# Patient Record
Sex: Female | Born: 1950 | Race: White | Hispanic: No | Marital: Married | Smoking: Former smoker
Health system: Southern US, Community
[De-identification: ages and names within clinical notes are randomized; demographics above are authoritative.]

## PROBLEM LIST (undated history)

## (undated) DIAGNOSIS — N840 Polyp of corpus uteri: Secondary | ICD-10-CM

## (undated) DIAGNOSIS — N3010 Interstitial cystitis (chronic) without hematuria: Secondary | ICD-10-CM

## (undated) DIAGNOSIS — I10 Essential (primary) hypertension: Secondary | ICD-10-CM

## (undated) DIAGNOSIS — G43909 Migraine, unspecified, not intractable, without status migrainosus: Secondary | ICD-10-CM

## (undated) DIAGNOSIS — N952 Postmenopausal atrophic vaginitis: Secondary | ICD-10-CM

## (undated) DIAGNOSIS — N938 Other specified abnormal uterine and vaginal bleeding: Secondary | ICD-10-CM

## (undated) DIAGNOSIS — M8580 Other specified disorders of bone density and structure, unspecified site: Secondary | ICD-10-CM

## (undated) DIAGNOSIS — K5792 Diverticulitis of intestine, part unspecified, without perforation or abscess without bleeding: Secondary | ICD-10-CM

## (undated) DIAGNOSIS — C801 Malignant (primary) neoplasm, unspecified: Secondary | ICD-10-CM

## (undated) DIAGNOSIS — E079 Disorder of thyroid, unspecified: Secondary | ICD-10-CM

## (undated) HISTORY — PX: TUBAL LIGATION: SHX77

## (undated) HISTORY — DX: Other specified disorders of bone density and structure, unspecified site: M85.80

## (undated) HISTORY — DX: Migraine, unspecified, not intractable, without status migrainosus: G43.909

## (undated) HISTORY — DX: Malignant (primary) neoplasm, unspecified: C80.1

## (undated) HISTORY — DX: Disorder of thyroid, unspecified: E07.9

## (undated) HISTORY — DX: Essential (primary) hypertension: I10

## (undated) HISTORY — DX: Other specified abnormal uterine and vaginal bleeding: N93.8

## (undated) HISTORY — DX: Interstitial cystitis (chronic) without hematuria: N30.10

## (undated) HISTORY — PX: BREAST SURGERY: SHX581

## (undated) HISTORY — DX: Polyp of corpus uteri: N84.0

## (undated) HISTORY — DX: Postmenopausal atrophic vaginitis: N95.2

---

## 1994-03-09 DIAGNOSIS — C801 Malignant (primary) neoplasm, unspecified: Secondary | ICD-10-CM

## 1994-03-09 HISTORY — DX: Malignant (primary) neoplasm, unspecified: C80.1

## 1997-11-14 ENCOUNTER — Emergency Department (HOSPITAL_COMMUNITY): Admission: EM | Admit: 1997-11-14 | Discharge: 1997-11-14 | Payer: Self-pay | Admitting: Family Medicine

## 1998-03-09 HISTORY — PX: DILATION AND CURETTAGE OF UTERUS: SHX78

## 1998-05-15 ENCOUNTER — Other Ambulatory Visit: Admission: RE | Admit: 1998-05-15 | Discharge: 1998-05-15 | Payer: Self-pay | Admitting: Obstetrics and Gynecology

## 1998-11-21 ENCOUNTER — Ambulatory Visit (HOSPITAL_COMMUNITY): Admission: RE | Admit: 1998-11-21 | Discharge: 1998-11-21 | Payer: Self-pay | Admitting: Obstetrics and Gynecology

## 1998-11-21 ENCOUNTER — Encounter: Payer: Self-pay | Admitting: Obstetrics and Gynecology

## 1999-01-16 ENCOUNTER — Encounter (INDEPENDENT_AMBULATORY_CARE_PROVIDER_SITE_OTHER): Payer: Self-pay | Admitting: Specialist

## 1999-01-16 ENCOUNTER — Ambulatory Visit (HOSPITAL_COMMUNITY): Admission: RE | Admit: 1999-01-16 | Discharge: 1999-01-16 | Payer: Self-pay | Admitting: Obstetrics and Gynecology

## 1999-02-26 ENCOUNTER — Other Ambulatory Visit: Admission: RE | Admit: 1999-02-26 | Discharge: 1999-02-26 | Payer: Self-pay | Admitting: Obstetrics and Gynecology

## 1999-02-26 ENCOUNTER — Encounter (INDEPENDENT_AMBULATORY_CARE_PROVIDER_SITE_OTHER): Payer: Self-pay

## 2000-01-01 ENCOUNTER — Other Ambulatory Visit: Admission: RE | Admit: 2000-01-01 | Discharge: 2000-01-01 | Payer: Self-pay | Admitting: Obstetrics and Gynecology

## 2000-11-18 ENCOUNTER — Encounter (INDEPENDENT_AMBULATORY_CARE_PROVIDER_SITE_OTHER): Payer: Self-pay | Admitting: *Deleted

## 2000-11-18 ENCOUNTER — Ambulatory Visit (HOSPITAL_COMMUNITY): Admission: RE | Admit: 2000-11-18 | Discharge: 2000-11-18 | Payer: Self-pay | Admitting: Obstetrics and Gynecology

## 2001-03-09 HISTORY — PX: VAGINAL HYSTERECTOMY: SUR661

## 2001-05-05 ENCOUNTER — Other Ambulatory Visit: Admission: RE | Admit: 2001-05-05 | Discharge: 2001-05-05 | Payer: Self-pay | Admitting: Obstetrics and Gynecology

## 2001-12-01 ENCOUNTER — Encounter (INDEPENDENT_AMBULATORY_CARE_PROVIDER_SITE_OTHER): Payer: Self-pay | Admitting: Specialist

## 2001-12-01 ENCOUNTER — Observation Stay (HOSPITAL_COMMUNITY): Admission: RE | Admit: 2001-12-01 | Discharge: 2001-12-02 | Payer: Self-pay | Admitting: Obstetrics and Gynecology

## 2002-05-23 ENCOUNTER — Other Ambulatory Visit: Admission: RE | Admit: 2002-05-23 | Discharge: 2002-05-23 | Payer: Self-pay | Admitting: Obstetrics and Gynecology

## 2002-07-11 ENCOUNTER — Encounter (INDEPENDENT_AMBULATORY_CARE_PROVIDER_SITE_OTHER): Payer: Self-pay | Admitting: Specialist

## 2002-07-11 ENCOUNTER — Ambulatory Visit (HOSPITAL_BASED_OUTPATIENT_CLINIC_OR_DEPARTMENT_OTHER): Admission: RE | Admit: 2002-07-11 | Discharge: 2002-07-11 | Payer: Self-pay | Admitting: Urology

## 2003-05-24 ENCOUNTER — Other Ambulatory Visit: Admission: RE | Admit: 2003-05-24 | Discharge: 2003-05-24 | Payer: Self-pay | Admitting: Obstetrics and Gynecology

## 2004-04-06 ENCOUNTER — Emergency Department (HOSPITAL_COMMUNITY): Admission: EM | Admit: 2004-04-06 | Discharge: 2004-04-06 | Payer: Self-pay | Admitting: Emergency Medicine

## 2004-04-10 ENCOUNTER — Ambulatory Visit: Payer: Self-pay | Admitting: Gastroenterology

## 2004-04-11 ENCOUNTER — Ambulatory Visit (HOSPITAL_COMMUNITY): Admission: RE | Admit: 2004-04-11 | Discharge: 2004-04-11 | Payer: Self-pay | Admitting: Gastroenterology

## 2004-04-11 ENCOUNTER — Ambulatory Visit: Payer: Self-pay | Admitting: Cardiology

## 2004-04-14 ENCOUNTER — Ambulatory Visit: Payer: Self-pay

## 2004-04-18 ENCOUNTER — Ambulatory Visit: Payer: Self-pay | Admitting: Gastroenterology

## 2004-04-18 ENCOUNTER — Ambulatory Visit: Payer: Self-pay | Admitting: Cardiology

## 2004-05-19 ENCOUNTER — Ambulatory Visit: Payer: Self-pay | Admitting: Gastroenterology

## 2004-05-27 ENCOUNTER — Other Ambulatory Visit: Admission: RE | Admit: 2004-05-27 | Discharge: 2004-05-27 | Payer: Self-pay | Admitting: Obstetrics and Gynecology

## 2004-06-02 ENCOUNTER — Ambulatory Visit (HOSPITAL_COMMUNITY): Admission: RE | Admit: 2004-06-02 | Discharge: 2004-06-02 | Payer: Self-pay | Admitting: Gynecology

## 2004-06-09 ENCOUNTER — Ambulatory Visit (HOSPITAL_COMMUNITY): Admission: RE | Admit: 2004-06-09 | Discharge: 2004-06-09 | Payer: Self-pay | Admitting: Gastroenterology

## 2004-06-19 ENCOUNTER — Ambulatory Visit: Payer: Self-pay | Admitting: Gastroenterology

## 2005-06-19 ENCOUNTER — Other Ambulatory Visit: Admission: RE | Admit: 2005-06-19 | Discharge: 2005-06-19 | Payer: Self-pay | Admitting: Obstetrics and Gynecology

## 2006-08-19 ENCOUNTER — Other Ambulatory Visit: Admission: RE | Admit: 2006-08-19 | Discharge: 2006-08-19 | Payer: Self-pay | Admitting: Obstetrics and Gynecology

## 2007-09-06 ENCOUNTER — Other Ambulatory Visit: Admission: RE | Admit: 2007-09-06 | Discharge: 2007-09-06 | Payer: Self-pay | Admitting: Obstetrics and Gynecology

## 2008-09-12 ENCOUNTER — Ambulatory Visit: Payer: Self-pay | Admitting: Obstetrics and Gynecology

## 2008-09-12 ENCOUNTER — Encounter: Payer: Self-pay | Admitting: Obstetrics and Gynecology

## 2008-09-12 ENCOUNTER — Other Ambulatory Visit: Admission: RE | Admit: 2008-09-12 | Discharge: 2008-09-12 | Payer: Self-pay | Admitting: Obstetrics and Gynecology

## 2009-09-04 ENCOUNTER — Emergency Department (HOSPITAL_COMMUNITY): Admission: EM | Admit: 2009-09-04 | Discharge: 2009-09-05 | Payer: Self-pay | Admitting: Emergency Medicine

## 2009-09-12 ENCOUNTER — Ambulatory Visit: Payer: Self-pay | Admitting: Oncology

## 2009-09-16 ENCOUNTER — Other Ambulatory Visit: Admission: RE | Admit: 2009-09-16 | Discharge: 2009-09-16 | Payer: Self-pay | Admitting: Obstetrics and Gynecology

## 2009-09-16 ENCOUNTER — Ambulatory Visit: Payer: Self-pay | Admitting: Obstetrics and Gynecology

## 2009-09-19 ENCOUNTER — Ambulatory Visit: Payer: Self-pay | Admitting: Obstetrics and Gynecology

## 2009-10-29 ENCOUNTER — Ambulatory Visit: Payer: Self-pay | Admitting: Obstetrics and Gynecology

## 2010-03-30 ENCOUNTER — Encounter: Payer: Self-pay | Admitting: Gynecology

## 2010-05-25 LAB — POCT CARDIAC MARKERS
CKMB, poc: 1.2 ng/mL (ref 1.0–8.0)
Myoglobin, poc: 73.8 ng/mL (ref 12–200)

## 2010-05-25 LAB — DIFFERENTIAL
Basophils Absolute: 0 10*3/uL (ref 0.0–0.1)
Basophils Relative: 1 % (ref 0–1)
Eosinophils Absolute: 0.1 10*3/uL (ref 0.0–0.7)
Lymphocytes Relative: 28 % (ref 12–46)
Neutro Abs: 3.8 10*3/uL (ref 1.7–7.7)

## 2010-05-25 LAB — URINALYSIS, ROUTINE W REFLEX MICROSCOPIC
Hgb urine dipstick: NEGATIVE
Urobilinogen, UA: 0.2 mg/dL (ref 0.0–1.0)
pH: 6.5 (ref 5.0–8.0)

## 2010-05-25 LAB — URINE MICROSCOPIC-ADD ON

## 2010-05-25 LAB — COMPREHENSIVE METABOLIC PANEL
AST: 21 U/L (ref 0–37)
Albumin: 4.2 g/dL (ref 3.5–5.2)
CO2: 25 mEq/L (ref 19–32)
Calcium: 9.1 mg/dL (ref 8.4–10.5)
Chloride: 108 mEq/L (ref 96–112)
Creatinine, Ser: 1.03 mg/dL (ref 0.4–1.2)
GFR calc Af Amer: >60 mL/min (ref >60)
Glucose, Bld: 101 mg/dL — ABNORMAL HIGH (ref 70–99)
Potassium: 3.4 mEq/L — ABNORMAL LOW (ref 3.5–5.1)
Total Bilirubin: 0.5 mg/dL (ref 0.3–1.2)

## 2010-05-25 LAB — CBC
HCT: 41 % (ref 36.0–46.0)
Platelets: 222 10*3/uL (ref 150–400)
RBC: 4.4 MIL/uL (ref 3.87–5.11)
RDW: 12.6 % (ref 11.5–15.5)

## 2010-05-25 LAB — LIPASE, BLOOD: Lipase: 46 U/L (ref 11–59)

## 2010-05-25 LAB — URINE CULTURE

## 2010-07-25 NOTE — Op Note (Signed)
[facility]  Patient:    Paige Spencer, Paige Spencer Visit Number: [id number] MRN: [id number]          Service Type: Attending:  Rande Brunt. Eda Paschal, M.D. Dictated by:   Rande Brunt. Eda Paschal, M.D. Proc. Date: 11/18/00                             Operative Report  PREOPERATIVE DIAGNOSIS:       Postmenopausal bleeding with enlarged endometrial stripe and hyperplasia on endometrial biopsy.  POSTOPERATIVE DIAGNOSIS:      Polypoid endometrium, postmenopausal bleeding, hyperplasia on endometrial biopsy.  OPERATION:                    Hysteroscopy, dilatation and curettage.  SURGEON:                      Daniel L. Eda Paschal, M.D.  ANESTHESIA:                   General.  INDICATIONS:                  The patient is a 59 year old, gravida 1, para 1, AB 0, who is status post breast cancer who was seen because of severe symptomatology.  Part of the evaluation included an [facility] which was in the menopausal range.  In spite of FSH of 55, she has continued to have some bleeding.  She had an endometrial done in the office which showed hyperplasia and on ultrasound she has an enlarged endometrial stripe at 10 mm.  As a result of this, she comes to the hospital for hysteroscopy, D&C and evaluation of the above.  FINDINGS:  External and vaginal within normal limits.  Cervix is clean. Uterus is normal size and shape.  Adnexa are palpably normal.  Rectal was deferred.  At the time of hysteroscopy, the patient had an appearance of multiple polypoid tissue in the uterus. There was no one large distinct polyp but a lot of little polyps as well as a lining that was consistent with hyperplasia.  After curettage and the cavity had been cleaned out, there was no other pathology noted at the time of hysteroscopy including in the endocervical canal.  DESCRIPTION OF PROCEDURE:  After adequate general endotracheal anesthesia, the patient was placed in the dorsal lithotomy position,  prepped and draped in the in the usual sterile manner.  A single-tooth tenaculum was placed on the anterior lip of the cervix.  The cervix was dilated to a #33 Pratt dilator and hysteroscopic examination was done using a hysteroscopic resectoscope and 3% sorbitol was used to expand into the uterine cavity and a camera was used for magnification.  A polypoid type uterus was seen.  Some of the polypoid tissue was removed with a resectoscope using a single wire loupe set at 70 coag 110 cutting.  Some was removed by sharp curettage.  A significant amount of tissue was removed and all was sent to pathology for tissue diagnosis.  At the termination of the procedure, there was no bleeding noted.  Blood loss was minimal.  Fluid deficit was less than 100 cc.  The patient tolerated the procedure well and left the operating room in satisfactory condition. Dictated by:   Rande Brunt. Eda Paschal, M.D. Attending:  Rande Brunt. Eda Paschal, M.D. DD:  11/18/00 TD:  11/18/00 Job: [id number]

## 2010-07-25 NOTE — H&P (Signed)
NAME:  Paige Spencer, Paige Spencer                         ACCOUNT NO.:  [id number]   MEDICAL RECORD NO.:  [id number]                   PATIENT TYPE:  AMB   LOCATION:  DAY                                  FACILITY:  [facility]   PHYSICIAN:  Daniel L. Eda Paschal, M.D.           DATE OF BIRTH:  08-Jan-1951   DATE OF ADMISSION:  12/01/2001  DATE OF DISCHARGE:                                HISTORY & PHYSICAL   CHIEF COMPLAINT:  Persistent menometrorrhagia.   HISTORY OF PRESENT ILLNESS:  The patient is a 60 year old gravida 1, para 1,  abortus 0, who over the past several years has had persistent  menometrorrhagia.  She has been treated countless times with progestin.  Estrogen has not been used because she has a history of breast cancer.  She  has had a D&C, hysteroscopy.  At that point she did have polyps.  Following  that, although the polyps were gone, an ultrasound showed no evidence of  recurrence.  She has continued to have persistent menometrorrhagia that has  required almost persistent use of Megace.  As a result of the above, she now  enters the hospital for vaginal hysterectomy.  Consultation was made with  Dr. Mancel Bale, who is her oncologist, about whether her ovaries should  be removed.  Dr. Truett Perna did not feel that removing her ovaries would  reduce her risk of a recurrence of her breast cancer and did not recommend  it for that reason.  BRCA1 and BRCA2 were done, however, because not only  has the patient had breast cancer but her sister and mother had it, and Dr.  Truett Perna and I both felt that if she had the gene then her ovaries should be  removed to reduce her risk of ovarian cancer.  However, the BRCA1 and BRCA2  tests were negative and, as a result of this, after an informed consent  about the fact that there still is a small risk of ovarian cancer in the  future, she would like to keep her ovaries.  Her reasoning is because of  hormonal support and the fact that she would be a  good candidate for  estrogen if we removed them.  She now enters the hospital for vaginal  hysterectomy for treatment of the above.   PAST MEDICAL HISTORY:  Breast cancer.  She is status post radiation,  lumpectomy, tamoxifen.  She has also had bilateral  reconstruction after her mastectomies.   MEDICATIONS:  Zoloft.  Topamax for headaches.  Imipramine for headaches as  well.   ALLERGIES:  No allergies.   PAST SURGICAL HISTORY:  Other surgeries besides her breast cancer surgery  were:  1. D&C in both 2000 and 2002 for dysfunctional uterine bleeding.  2. Tubal ligation.  3. Cesarean section.   FAMILY HISTORY:  Mother is diabetic.  She has also had breast cancer.  Mother and brother have hypertension.  Father and brother have  heart  disease.  Sister has had breast cancer as well as the patient and her  mother.   SOCIAL HISTORY:  She is a nonsmoker, nondrinker.   REVIEW OF SYSTEMS:  HEENT:  Negative except for migraines.  CARDIAC:  Negative.  RESPIRATORY:  Negative.  GASTROINTESTINAL:  Negative.  GENITOURINARY:  Negative.  NEUROMUSCULAR:  Negative.  ALLERGIC,  IMMUNOLOGICAL, LYMPHATIC, and ENDOCRINE:  Negative.   PHYSICAL EXAMINATION:  GENERAL:  Well-developed, well-nourished female in no  acute distress.  VITAL SIGNS:  Blood pressure is 118/74, pulse 70 and regular, respirations  16 and nonlabored.  She is afebrile.  HEENT:  All within normal limits.  NECK:  Supple.  Trachea is midline.  Thyroid is not enlarged.  LUNGS:  Clear to P&A.  HEART:  No thrills, heaves, or murmurs.  BREASTS:  Status post bilateral reconstruction.  Without masses.  ABDOMEN:  Soft.  Without guarding, rebound, or masses.  PELVIC:  External and vaginal are within normal limits.  Cervix is clean.  Pap smear shows no atypia.  Uterus is slightly boggy and enlarged.  Adnexa  are palpably normal.  RECTAL:  Negative.  EXTREMITIES:  Within normal limits.   ADMISSION IMPRESSION:  Menometrorrhagia.   PLAN:   Vaginal hysterectomy.                                               Daniel L. Eda Paschal, M.D.    Paige Spencer  D:  12/01/2001  T:  12/01/2001  Job:  [id number]

## 2010-07-25 NOTE — Op Note (Signed)
   NAME:  Paige Spencer, Paige Spencer                         ACCOUNT NO.:  [id number]   MEDICAL RECORD NO.:  [id number]                   PATIENT TYPE:  AMB   LOCATION:  [facility]                                 FACILITY:  [facility]   PHYSICIAN:  Ronald L. Ovidio Hanger, M.D.           DATE OF BIRTH:  1950/07/28   DATE OF PROCEDURE:  07/11/2002  DATE OF DISCHARGE:                                 OPERATIVE REPORT   PREOPERATIVE DIAGNOSIS:  Possible interstitial cystitis.   POSTOPERATIVE DIAGNOSIS:  Possible interstitial cystitis.   PROCEDURE:  Cystourethroscopy and hydraulic bladder distention, bladder  biopsy.   SURGEON:  Lucrezia Starch. Earlene Plater, M.D.   ANESTHESIA:  General laryngeal.   ESTIMATED BLOOD LOSS:  Negligible.   TUBES:  None.   COMPLICATIONS:  None.   TOTAL CAPACITY:  700 mL at 80 cm of water with submucosal glomerulations  noted.   INDICATIONS FOR PROCEDURE:  [name] is a lovely 60 year old white female  who presented with pain and burning in the lower abdominal area. She had had  some increased pain since her hysterectomy and section obtained before the  hysterectomy. She notes that she had had a hematoma after her hysterectomy  that was drained and she has had suprapubic pressure that radiates to the  back also. Has some hesitancy and intermittency and has nocturia x3. She  really does not note pain relieved by voiding. After understanding the  risks, benefits, and alternatives, she elected to proceed with the above  procedure.   DESCRIPTION OF PROCEDURE:  The patient was placed in the supine position and  after proper general laryngeal airway anesthesia was placed in the dorsal  lithotomy position, prepped and draped with Betadine in a sterile fashion.  Cystourethroscopy was performed with a 22.5-French Olympus panendoscope.  Utilizing the 12 and 70-degree lenses, the bladder was carefully inspected  and noted to be without lesions. It was smooth walled and efflux of clear  urine was  noted from the normally placed ureteral orifices bilaterally.  Hydraulic distention was performed gradually to 80 cm of water and total  capacity was 700 mL and when it was drained, there were diffuse submucosal  glomerulations and some bleeding points actually in the posterior midline.  Biopsy was obtained from the posterior midline, submitted to pathology and  the base was cauterized with Bugbee coagulation cautery. Reinspection of the  bladder revealed no further lesions were present. The bladder was drained,  the panendoscope was removed, and the patient was taken to the recovery room  stable.                                               Ronald L. Ovidio Hanger, M.D.   RLD/MEDQ  D:  07/11/2002  T:  07/11/2002  Job:  [id number]

## 2010-07-25 NOTE — Op Note (Signed)
NAME:  Paige Spencer, Paige Spencer                         ACCOUNT NO.:  [id number]   MEDICAL RECORD NO.:  [id number]                   PATIENT TYPE:  AMB   LOCATION:  DAY                                  FACILITY:  [facility]   PHYSICIAN:  Daniel L. Eda Paschal, M.D.           DATE OF BIRTH:  21-Jun-1950   DATE OF PROCEDURE:  12/01/2001  DATE OF DISCHARGE:                                 OPERATIVE REPORT   PREOPERATIVE DIAGNOSIS:  Menometorrhagia.   POSTOPERATIVE DIAGNOSIS:  Menometorrhagia.   OPERATION:  Vaginal hysterectomy.   SURGEON:  Daniel L. Eda Paschal, M.D.   FIRST ASSISTANT:  Devin M. Ciliberti, M.D.   FINDINGS:  At the time of surgery, the patient's uterus was slightly  enlarged and boggy.  Ovaries and fallopian tubes were normal.  There were no  pelvic adhesions.  There was no evidence of endometriosis.  Vesicouterine  fold to peritoneum from the patient's previous bladder surgery and C-section  was adherent to the uterus.   DESCRIPTION OF PROCEDURE:  After adequate general endotracheal anesthesia,  the patient was placed in the dorsal lithotomy position, prepped and draped  in the usual sterile manner.  A 1:200,000 solution of epinephrine and 0.5%  Xylocaine was injected around the cervix.  A 360 degree incision was made  around the cervix.  The bladder was mobilized superiorly as was the  posterior peritoneum.  The posterior peritoneum was entered by sharp  dissection.  The bladder flap was advanced which was difficult because of  adherence from her previous C-section.  The uterosacral ligaments were  clamped.  In clamping them, they were shortened.  They were the sutured to  the vault laterally for good vault support.  The cardinal ligaments were  clamped, cut, and suture ligated.  The uterine arteries were clamped, cut,  and doubly suture ligated.  Suture material for all of these pedicles as  well as the ones described below were all #1 chromic catgut.  At this point,  the  bladder was emptied with a Foley catheter because of the adherence to  make sure that we could keep the bladder out of the way to separate the  bladder from the uterus.  Clear urine drained.  A little indigo carmine was  placed in the bladder so that if there was a leak, it would be evident.  The  uterus was delivered and,with a finger behind the uterus, the proper plane  could be obtained and, using sharp dissection, the bladder was dissected  free.  The vesicouterine fold to peritoneum was now entered.  The bounds of  the broad ligament, uteroovarian ligaments, round ligaments, and fallopian  tubes were clamped, cut, and doubly suture ligated with #1 chromic catgut.  The uterus had to be somewhat morcellated to get it out, and it was sent to  pathology in pieces for tissue diagnosis.  Ovaries, tubes, and pelvic  peritoneum were free of disease.  The cuff was whip stitched with a running,  locking 0 Vicryl.  A 2-0 Vicryl modified McCall suture was placed to prevent  an enterocele, and then the vaginal cuff was closed with figure-of-eight of  #1 chromic after copious irrigation had been done with Ringer's lactate.  At  the termination of the procedure, there was no bleeding noted.  Estimated  blood loss for the entire procedure was 100 cc with none replaced.  The  patient tolerated the procedure well and left the operating room in  satisfactory condition, draining clear urine from her Foley catheter.                                               Daniel L. Eda Paschal, M.D.   Paige Spencer  D:  12/01/2001  T:  12/01/2001  Job:  [id number]

## 2010-11-03 ENCOUNTER — Encounter: Payer: Self-pay | Admitting: Gynecology

## 2010-11-03 DIAGNOSIS — M8580 Other specified disorders of bone density and structure, unspecified site: Secondary | ICD-10-CM | POA: Insufficient documentation

## 2010-11-03 DIAGNOSIS — G43909 Migraine, unspecified, not intractable, without status migrainosus: Secondary | ICD-10-CM | POA: Insufficient documentation

## 2010-11-03 DIAGNOSIS — I10 Essential (primary) hypertension: Secondary | ICD-10-CM | POA: Insufficient documentation

## 2010-11-03 DIAGNOSIS — E079 Disorder of thyroid, unspecified: Secondary | ICD-10-CM | POA: Insufficient documentation

## 2010-11-03 DIAGNOSIS — N938 Other specified abnormal uterine and vaginal bleeding: Secondary | ICD-10-CM | POA: Insufficient documentation

## 2010-11-03 DIAGNOSIS — N840 Polyp of corpus uteri: Secondary | ICD-10-CM | POA: Insufficient documentation

## 2010-11-03 DIAGNOSIS — N3010 Interstitial cystitis (chronic) without hematuria: Secondary | ICD-10-CM | POA: Insufficient documentation

## 2010-11-03 DIAGNOSIS — N952 Postmenopausal atrophic vaginitis: Secondary | ICD-10-CM | POA: Insufficient documentation

## 2010-11-12 ENCOUNTER — Ambulatory Visit (INDEPENDENT_AMBULATORY_CARE_PROVIDER_SITE_OTHER): Payer: BC Managed Care – PPO | Admitting: Obstetrics and Gynecology

## 2010-11-12 ENCOUNTER — Other Ambulatory Visit (HOSPITAL_COMMUNITY)
Admission: RE | Admit: 2010-11-12 | Discharge: 2010-11-12 | Disposition: A | Discharge status: Home / Self Care | Payer: BC Managed Care – PPO | Source: Ambulatory Visit | Attending: Obstetrics and Gynecology | Admitting: Obstetrics and Gynecology

## 2010-11-12 ENCOUNTER — Encounter: Payer: Self-pay | Admitting: Obstetrics and Gynecology

## 2010-11-12 VITALS — BP 132/80 | Ht 65.0 in | Wt 145.0 lb

## 2010-11-12 DIAGNOSIS — D0590 Unspecified type of carcinoma in situ of unspecified breast: Secondary | ICD-10-CM

## 2010-11-12 DIAGNOSIS — Z01419 Encounter for gynecological examination (general) (routine) without abnormal findings: Secondary | ICD-10-CM | POA: Insufficient documentation

## 2010-11-12 DIAGNOSIS — M899 Disorder of bone, unspecified: Secondary | ICD-10-CM

## 2010-11-12 DIAGNOSIS — M949 Disorder of cartilage, unspecified: Secondary | ICD-10-CM

## 2010-11-12 DIAGNOSIS — M8580 Other specified disorders of bone density and structure, unspecified site: Secondary | ICD-10-CM

## 2010-11-12 DIAGNOSIS — N3010 Interstitial cystitis (chronic) without hematuria: Secondary | ICD-10-CM

## 2010-11-12 NOTE — Progress Notes (Signed)
Patient came to see me today for an annual GYN exam. She is doing well with no real gynecological problems. She is up-to-date on mammograms. She has osteopenia without an elevated FRAX risk. She takes her calcium and a day. She sees Dr. Earlene Plater for treatment of interstitial cystitis.   HEENT: Within normal limits. Neck: No masses. Supraclavicular lymph nodes: Not enlarged. Breasts: Examined in both sitting and lying position. Symmetrical without skin changes or masses. S/p right lumpectomy, bilateral implants. Abdomen: Soft no masses guarding or rebound. No hernias. Pelvic: External within normal limits. BUS within normal limits. Vaginal examination: poor estrogen effect, no cystocele enterocele or rectocele. Cervix and uterus absent. Adnexa within normal limits. Rectovaginal confirmatory. Extremities within normal limits.  Assessment: 1. Breast cancer 2. Atrophic vaginitis 3. Osteopenia  Plan: Continuing mammograms. Bone density next year.

## 2011-02-05 ENCOUNTER — Encounter: Payer: Self-pay | Admitting: Obstetrics and Gynecology

## 2011-06-30 ENCOUNTER — Encounter: Payer: Self-pay | Admitting: *Deleted

## 2011-07-01 ENCOUNTER — Encounter: Payer: Self-pay | Admitting: *Deleted

## 2011-07-01 ENCOUNTER — Ambulatory Visit (INDEPENDENT_AMBULATORY_CARE_PROVIDER_SITE_OTHER): Payer: BC Managed Care – PPO | Admitting: *Deleted

## 2011-07-01 ENCOUNTER — Encounter: Payer: Self-pay | Admitting: Vascular Surgery

## 2011-07-01 DIAGNOSIS — I781 Nevus, non-neoplastic: Secondary | ICD-10-CM

## 2011-07-01 NOTE — Progress Notes (Signed)
X=>3% Sotradecol administered with a 27g butterfly.  Patient received a total of 12cc.  Treated the majority of her veins. Easy access. tol well. Antic good results. Mostly blue and red spiders. Afew small retics on back of Right knee and calf. Follow prn.  Photos: yes  Compression stockings applied: yes

## 2011-08-25 ENCOUNTER — Encounter: Payer: BC Managed Care – PPO | Admitting: Vascular Surgery

## 2011-10-27 ENCOUNTER — Other Ambulatory Visit: Payer: Self-pay | Admitting: Obstetrics and Gynecology

## 2011-10-27 DIAGNOSIS — M8580 Other specified disorders of bone density and structure, unspecified site: Secondary | ICD-10-CM

## 2011-11-03 ENCOUNTER — Ambulatory Visit (INDEPENDENT_AMBULATORY_CARE_PROVIDER_SITE_OTHER): Payer: BC Managed Care – PPO

## 2011-11-03 DIAGNOSIS — M8580 Other specified disorders of bone density and structure, unspecified site: Secondary | ICD-10-CM

## 2011-11-03 DIAGNOSIS — M810 Age-related osteoporosis without current pathological fracture: Secondary | ICD-10-CM

## 2011-11-13 ENCOUNTER — Encounter: Payer: Self-pay | Admitting: Obstetrics and Gynecology

## 2011-11-17 ENCOUNTER — Encounter: Payer: Self-pay | Admitting: Obstetrics and Gynecology

## 2011-11-17 ENCOUNTER — Ambulatory Visit (INDEPENDENT_AMBULATORY_CARE_PROVIDER_SITE_OTHER): Payer: BC Managed Care – PPO | Admitting: Obstetrics and Gynecology

## 2011-11-17 VITALS — BP 120/78 | Ht 65.0 in | Wt 147.0 lb

## 2011-11-17 DIAGNOSIS — Z01419 Encounter for gynecological examination (general) (routine) without abnormal findings: Secondary | ICD-10-CM

## 2011-11-17 DIAGNOSIS — M810 Age-related osteoporosis without current pathological fracture: Secondary | ICD-10-CM

## 2011-11-17 NOTE — Progress Notes (Addendum)
Patient came to see me today for her annual GYN exam. She is a breast cancer survivor having had breast cancer in 1996 which she was treated with lumpectomy, radiation and tamoxifen. She later had breast reconstruction. She had a vaginal hysterectomy in 2003 for dysfunctional uterine bleeding. She has always had normal Pap smears. Her last Pap smear was 2012. She has a thyroid goiter and is watched in [place]. She has had ultrasounds which were nonsuspicious but it is now bothering her more and she is considering surgery. She is having difficulty swallowing. She has atrophic vaginitis and is using over-the-counter medication when she has intercourse. She is having no vaginal bleeding. She is having no pelvic pain. She is due for followup mammogram next month. Her bone density was done this August and she now has osteoporosis rather than osteopenia. She has had no fractures. She has a calcium rich diet. She was started on 50,000 IUs of vitamin D after low vitamin D level. It is time to have that rechecked.both patient and her sister had early onset breast cancer. Patient was checked for BRCA1 and BRCA2 and was negative. We will get her old chart to skin and report.  Physical examination:Kim Julian Reil present. HEENT within normal limits. Neck: Thyroid diffusely enlargedus. No masses. Supraclavicular nodes: not enlarged. Breasts: Examined in both sitting and lying  position. No skin changes and no masses. Status post bilateral implants and reconstruction on right. Abdomen: Soft no guarding rebound or masses or hernia. Pelvic: External: Within normal limits. BUS: Within normal limits. Vaginal:within normal limits. poor estrogen effect. No evidence of cystocele rectocele or enterocele. Cervix: absent. Uterus:absent. Adnexa: No masses. Rectovaginal exam: Confirmatory and negative. Extremities: Within normal limits.  Assessment: #1. Osteoporosis #2. Atrophic vaginitis #3. Thyroid nodule #4. Breast  cancer  Plan: The patient was interested in the second opinion on her thyroid and I gave her Dr. Ricky Stabs name. We discussed treatment of osteoporosis and we will try to get her approved for Prolia based on her thyroid nodule. She will continue yearly mammograms. Calcium and vitamin D levels done.The new Pap smear guidelines were discussed with the patient. No Pap done.

## 2011-11-17 NOTE — Patient Instructions (Signed)
Continue yearly mammograms 

## 2011-11-18 LAB — URINALYSIS W MICROSCOPIC + REFLEX CULTURE
Bilirubin Urine: NEGATIVE
Casts: NONE SEEN
Crystals: NONE SEEN
Glucose, UA: NEGATIVE mg/dL
Hgb urine dipstick: NEGATIVE
Ketones, ur: NEGATIVE mg/dL
Leukocytes, UA: NEGATIVE
Nitrite: NEGATIVE
Protein, ur: NEGATIVE mg/dL
Urobilinogen, UA: 0.2 mg/dL (ref 0.0–1.0)

## 2011-11-18 LAB — PTH, INTACT AND CALCIUM: PTH: 52.1 pg/mL (ref 14.0–72.0)

## 2011-12-10 ENCOUNTER — Telehealth: Payer: Self-pay | Admitting: *Deleted

## 2011-12-10 NOTE — Telephone Encounter (Signed)
Pt was called to inform her that her insurance does cover the Prolia, since she has met her deductible this year and the medication was approved by her insurance she will not have to pay anything out of pocket. I left her a message to call back to set up a time for her injection. KW/G

## 2011-12-29 NOTE — Telephone Encounter (Signed)
LM for pt to call back regarding Prolia KW 

## 2012-01-22 ENCOUNTER — Encounter: Payer: Self-pay | Admitting: *Deleted

## 2012-01-22 NOTE — Telephone Encounter (Signed)
Letter mailed to pt since no response from left messages. KW

## 2012-01-29 ENCOUNTER — Other Ambulatory Visit: Payer: Self-pay

## 2012-02-09 NOTE — Telephone Encounter (Signed)
Pt never returned call or response to letter.  Information filed. KW

## 2012-02-10 ENCOUNTER — Encounter: Payer: Self-pay | Admitting: Obstetrics and Gynecology

## 2012-08-31 ENCOUNTER — Other Ambulatory Visit: Payer: Self-pay | Admitting: Endocrinology

## 2012-08-31 DIAGNOSIS — E049 Nontoxic goiter, unspecified: Secondary | ICD-10-CM

## 2012-09-02 ENCOUNTER — Ambulatory Visit
Admission: RE | Admit: 2012-09-02 | Discharge: 2012-09-02 | Disposition: A | Discharge status: Home / Self Care | Payer: BC Managed Care – PPO | Source: Ambulatory Visit | Attending: Endocrinology | Admitting: Endocrinology

## 2012-09-02 DIAGNOSIS — E049 Nontoxic goiter, unspecified: Secondary | ICD-10-CM

## 2012-09-15 ENCOUNTER — Other Ambulatory Visit: Payer: Self-pay | Admitting: Endocrinology

## 2012-09-15 DIAGNOSIS — E042 Nontoxic multinodular goiter: Secondary | ICD-10-CM

## 2012-09-27 ENCOUNTER — Ambulatory Visit
Admission: RE | Admit: 2012-09-27 | Discharge: 2012-09-27 | Disposition: A | Discharge status: Home / Self Care | Payer: BC Managed Care – PPO | Source: Ambulatory Visit | Attending: Endocrinology | Admitting: Endocrinology

## 2012-09-27 ENCOUNTER — Other Ambulatory Visit (HOSPITAL_COMMUNITY)
Admission: RE | Admit: 2012-09-27 | Discharge: 2012-09-27 | Disposition: A | Discharge status: Home / Self Care | Payer: BC Managed Care – PPO | Source: Ambulatory Visit | Attending: Interventional Radiology | Admitting: Interventional Radiology

## 2012-09-27 DIAGNOSIS — E042 Nontoxic multinodular goiter: Secondary | ICD-10-CM

## 2012-09-27 DIAGNOSIS — E049 Nontoxic goiter, unspecified: Secondary | ICD-10-CM | POA: Insufficient documentation

## 2013-03-08 ENCOUNTER — Encounter: Payer: Self-pay | Admitting: Gynecology

## 2013-07-18 IMAGING — US US SOFT TISSUE HEAD/NECK
1 series · 13 of 25 positions shown · non-contrast
Comparison: Ultrasound of the thyroid from [REDACTED]
dated 03/07/2010

CLINICAL DATA: History of thyroid goiter, follow-up

THYROID ULTRASOUND
TECHNIQUE: Ultrasound examination of the thyroid gland and adjacent
soft tissues was performed.

[Series 1: us soft tissue head/neck · 0.08mm/px · 13 of 75 slices shown]
[im 1/75]
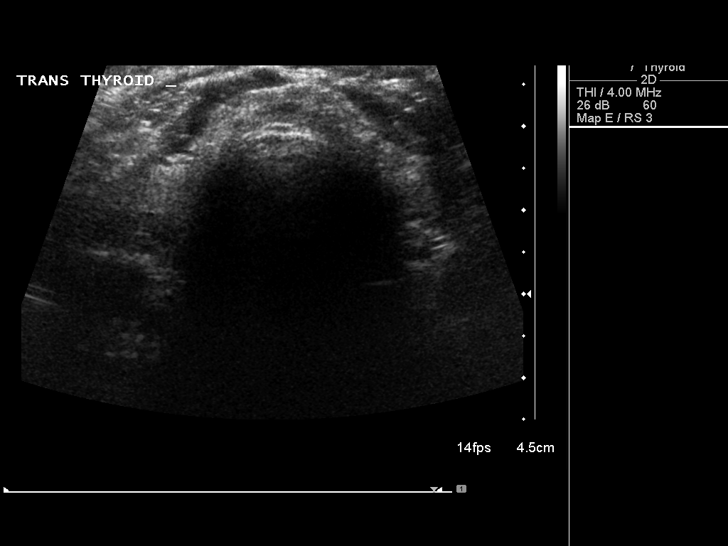
[im 7/75]
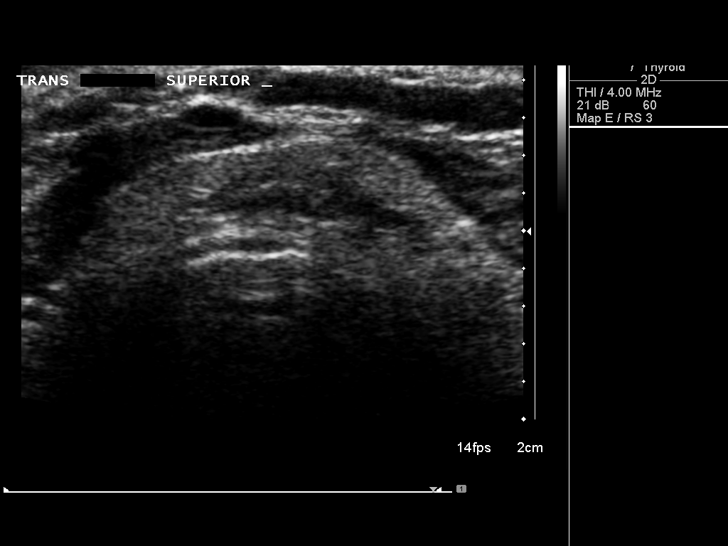
[im 13/75]
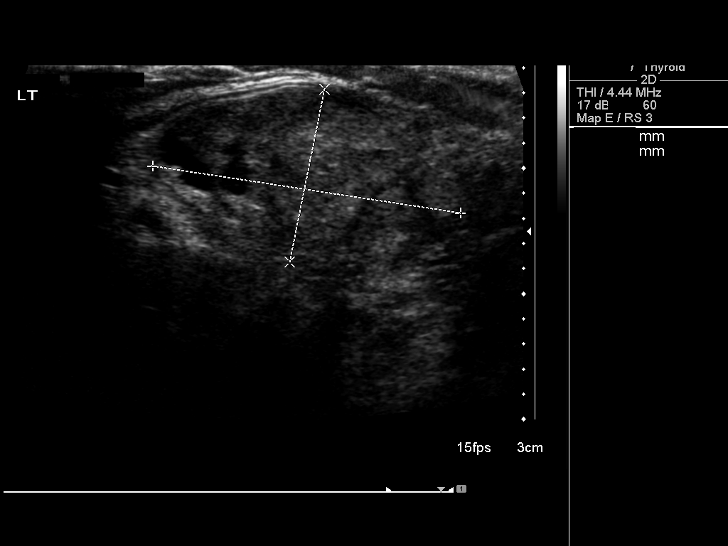
[im 19/75]
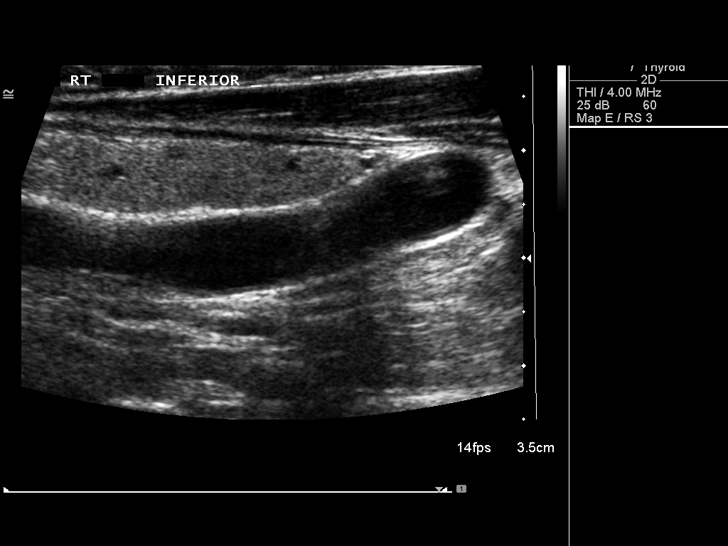
[im 25/75]
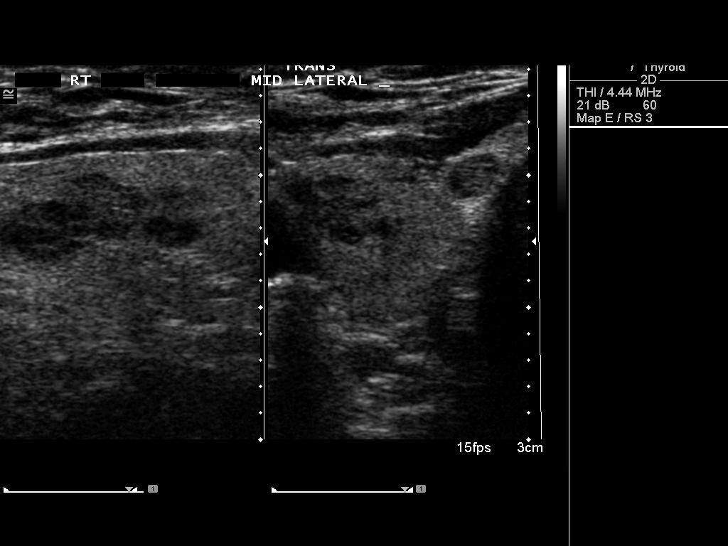
[im 31/75]
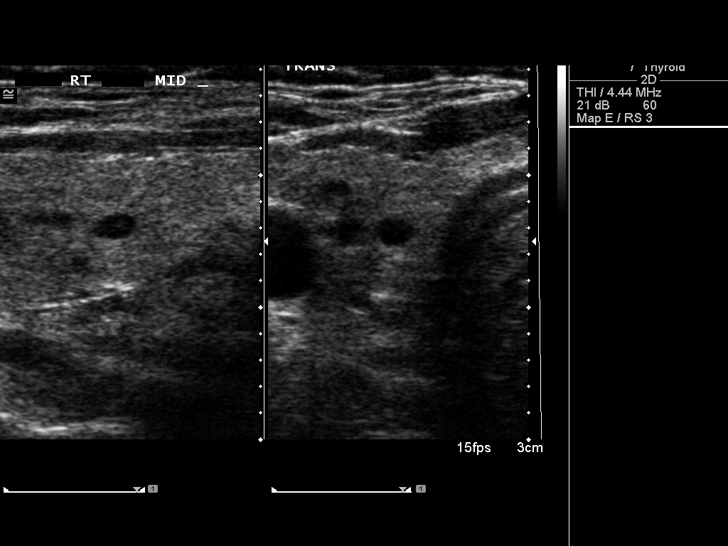
[im 38/75]
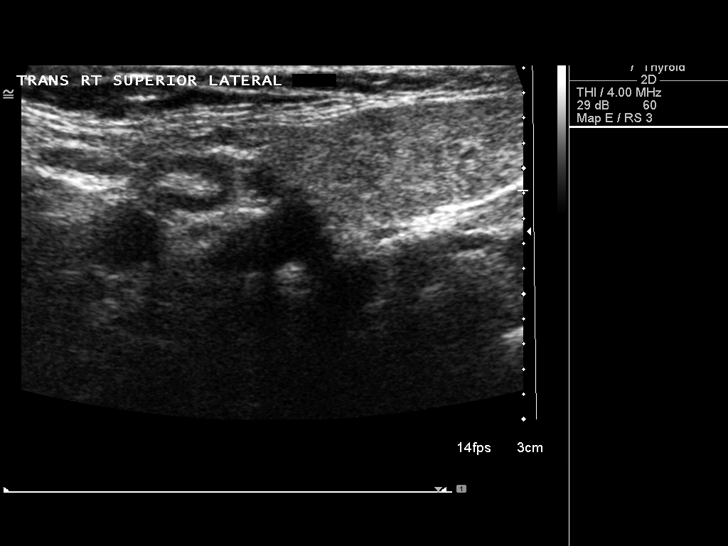
[im 44/75]
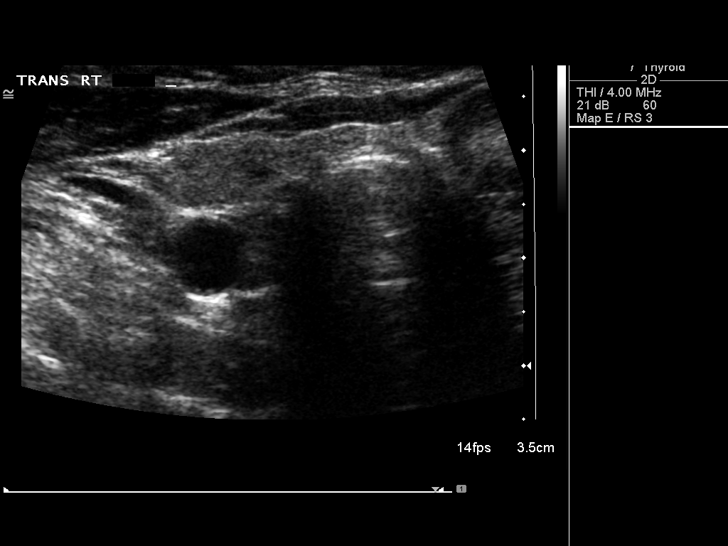
[im 50/75]
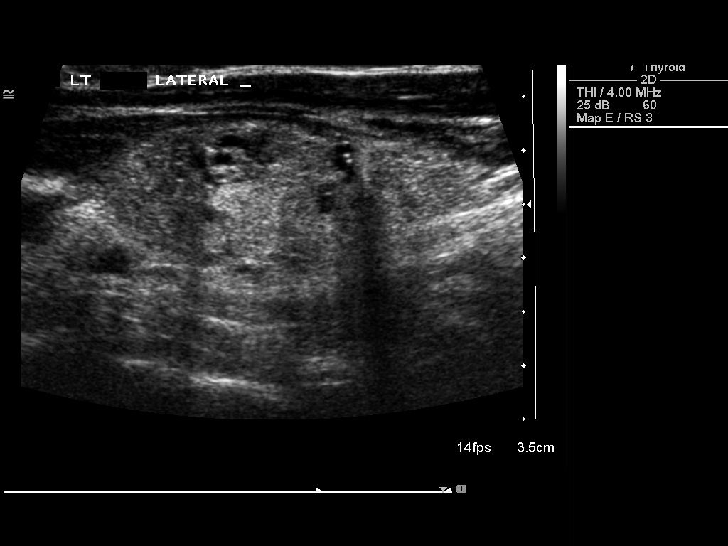
[im 56/75]
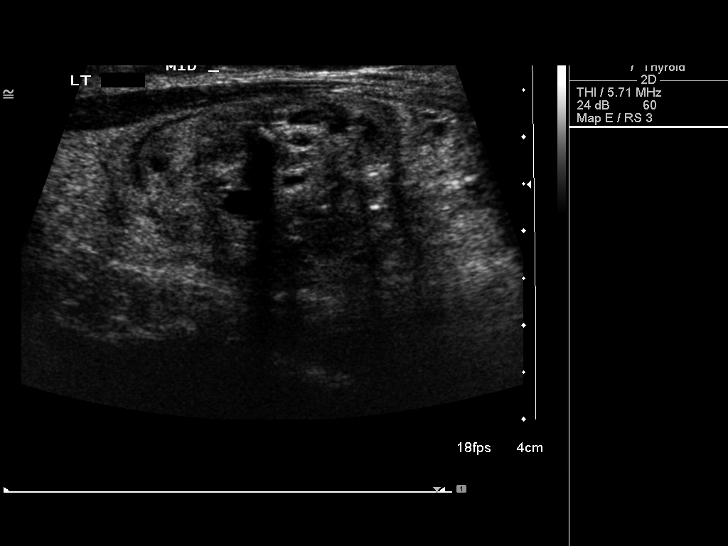
[im 62/75]
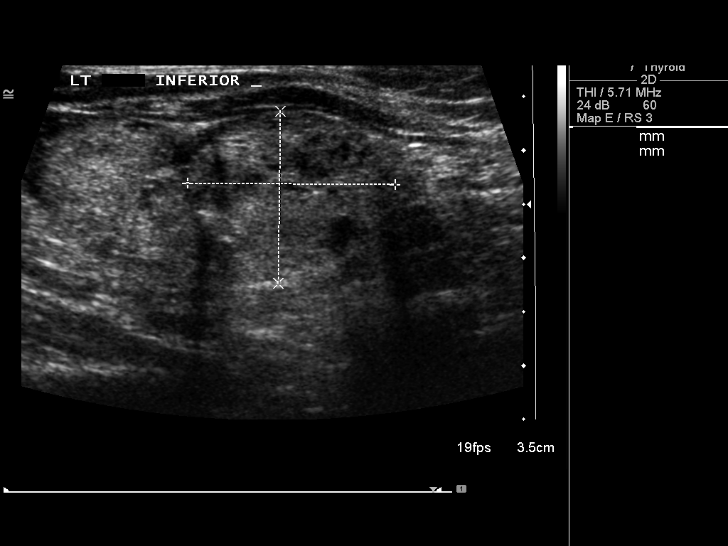
[im 68/75]
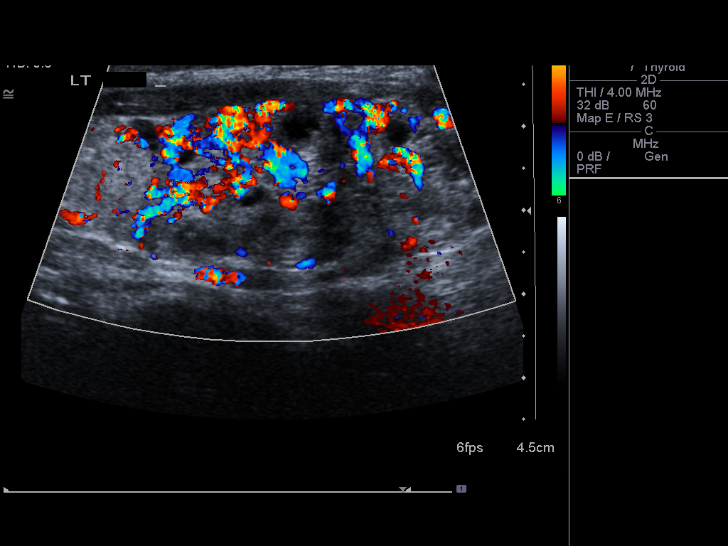
[im 75/75]
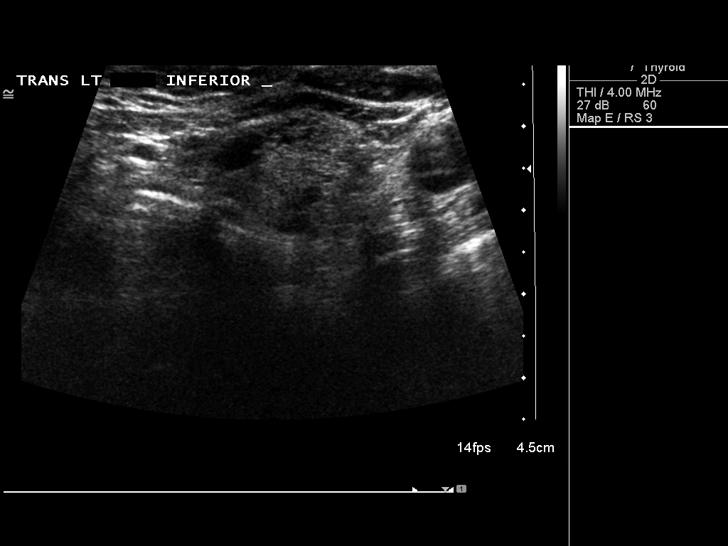

[13 of 25 positions shown; findings below may reference images not displayed]

FINDINGS: Right thyroid lobe:  5.8 x 1.5 x 1.7 cm.  (Previously 5.5 x 1.3 x
1.5 cm.
Left thyroid lobe:  5.9 x 2.3 x 2.7 cm.  (Previously 6.3 x 1.9 x
2.1 cm).
Isthmus:  1.6 cm in thickness.

Focal nodules:  The echogenicity of the thyroid parenchyma is
diffusely inhomogeneous.  Multiple solid thyroid nodules are
present bilaterally.  The dominant solid nodule occupies much of
the mid left lobe measuring 2.9 x 2.1 x 2.7 cm compared to prior
measurements of 4.8 x 2.1 x 2.5 cm.  A solid nodule in the lower
pole of the left lobe measures 1.9 x 1.6 x 2.4 cm, not previously
measured.

The isthmic nodule to the left of midline inferiorly measures 2.5 x
1.6 x 2.0 cm, not previously measured.

Multiple small right thyroid nodules are present of no more than 8
mm in diameter.

Lymphadenopathy:  None visualized.
IMPRESSION: Multiple thyroid nodules consistent with multinodular goiter.
Several large nodules were not definitely seen previously and
either biopsy or close follow-up is recommended.

## 2013-08-12 IMAGING — US US THYROID BIOPSY
1 series · 8 of 8 positions shown · non-contrast
Comparison: 09/02/2012
COMPARISON: 09/02/2012

CLINICAL DATA: Dominant isthmus and left lower pole thyroid
nodules

ULTRASOUND GUIDED NEEDLE ASPIRATE BIOPSY OF THE ISTHMUS THYROID
GLAND

[Series 1: us thyroid biopsy · 8 acquisitions, 8 frames shown]
[im 1/8]
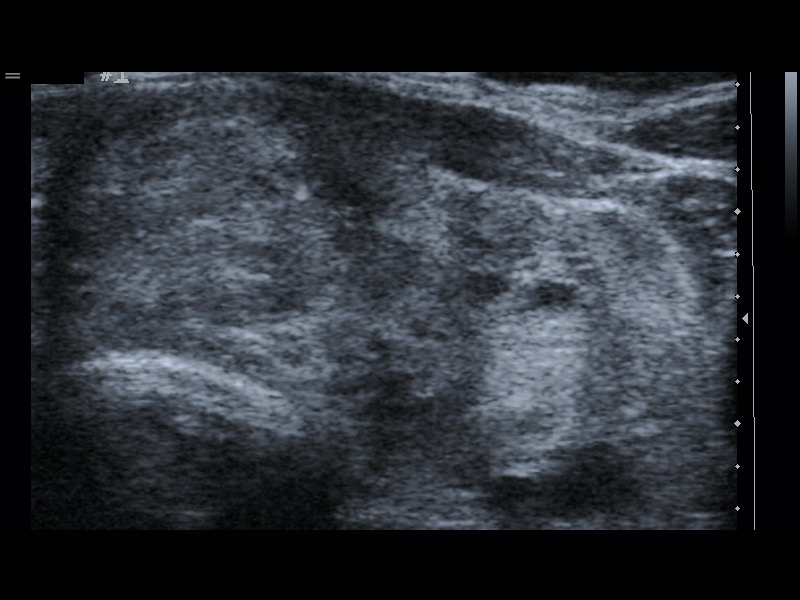
[im 2/8]
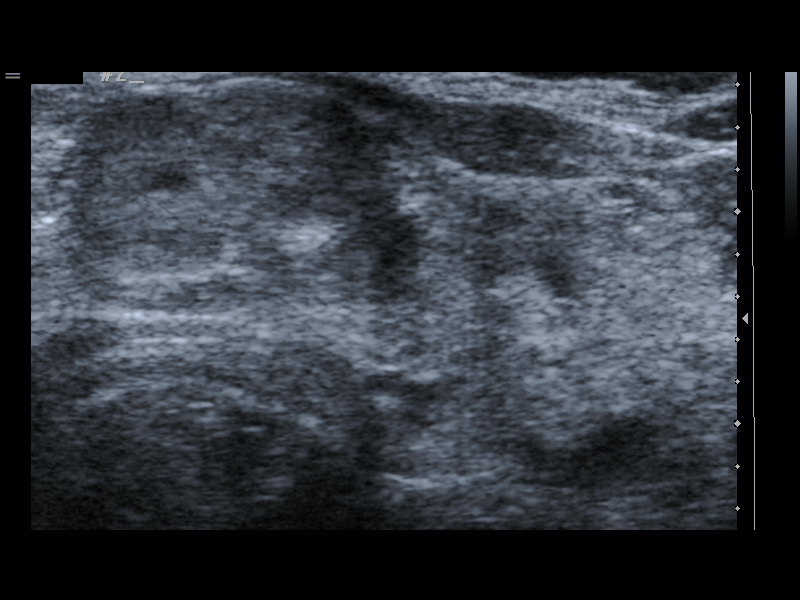
[im 3/8]
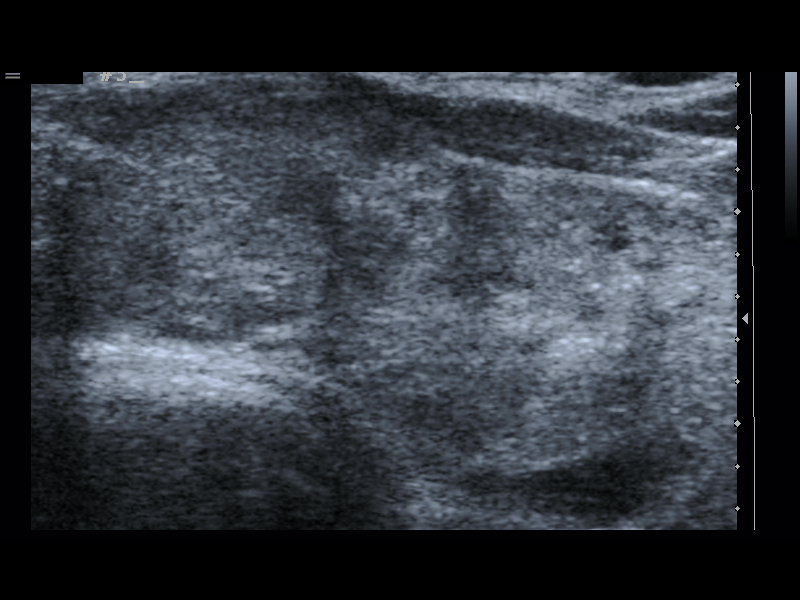
[im 4/8]
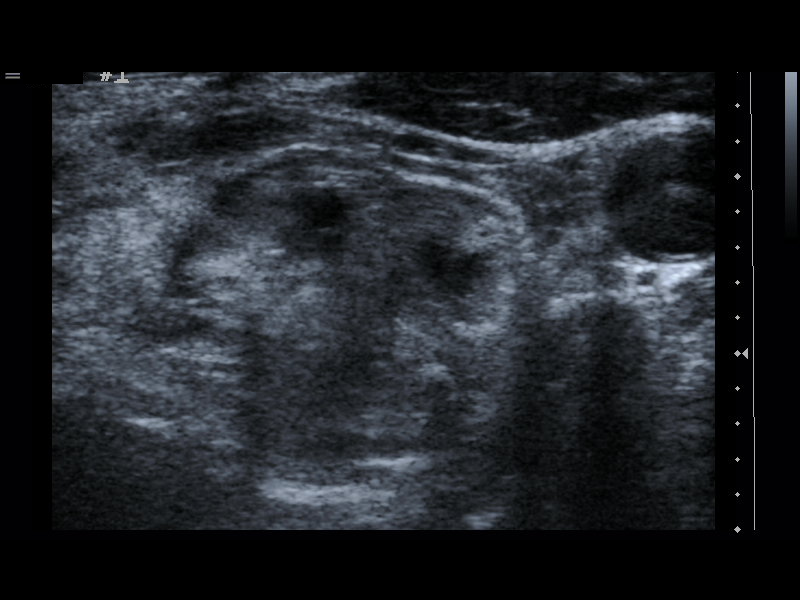
[im 5/8]
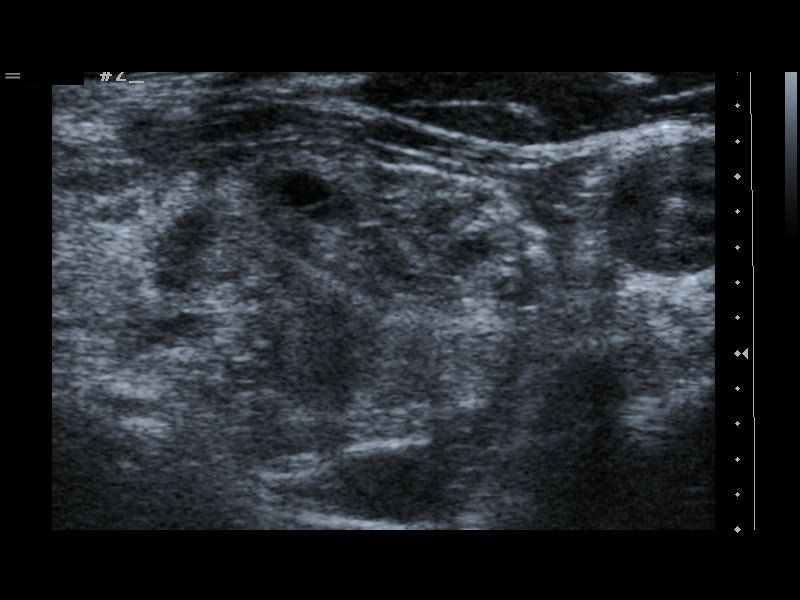
[im 6/8]
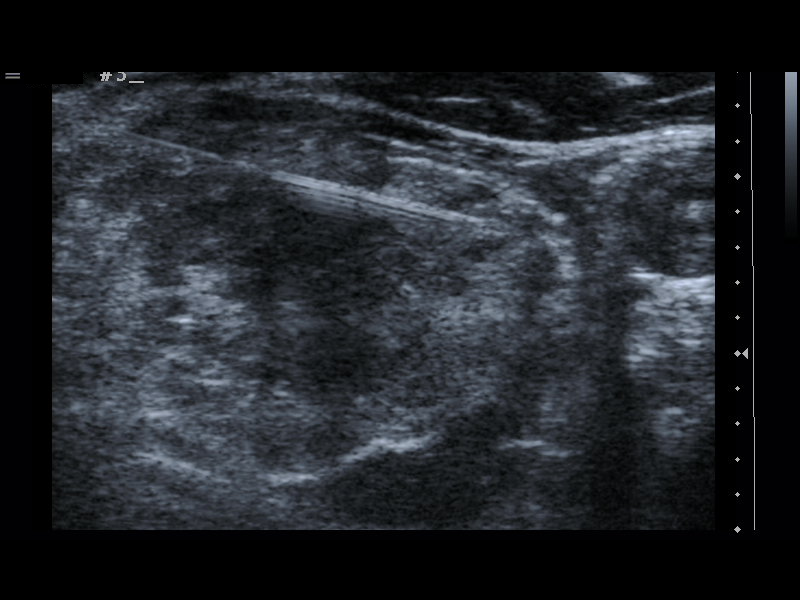
[im 7/8]
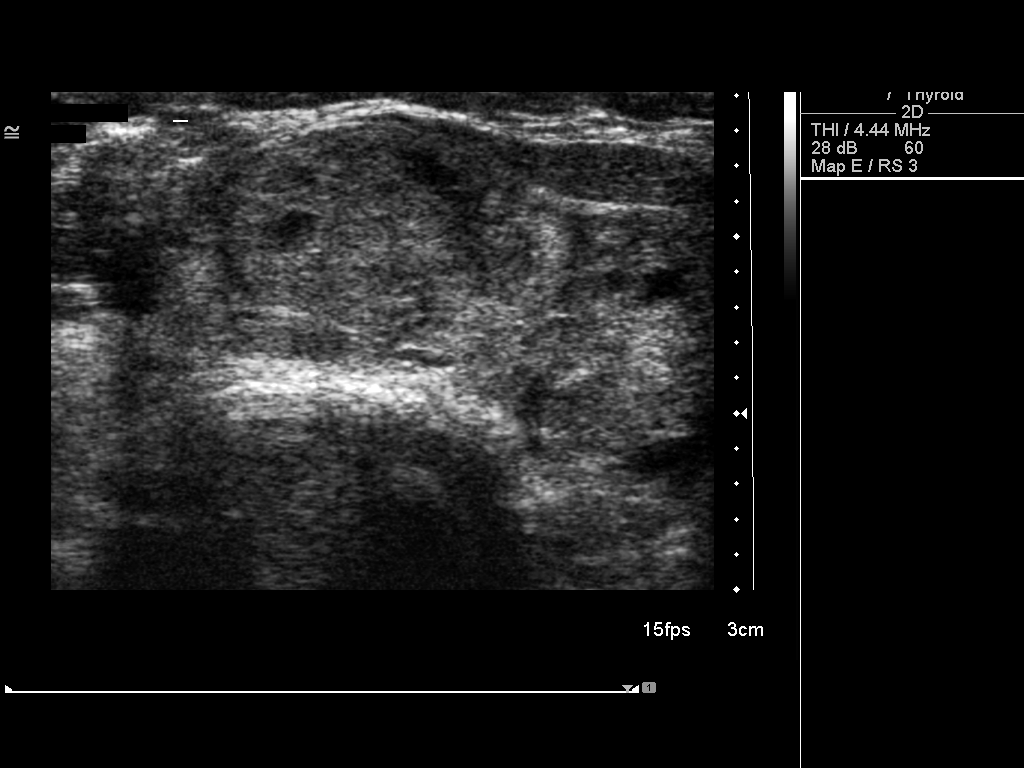
[im 8/8]
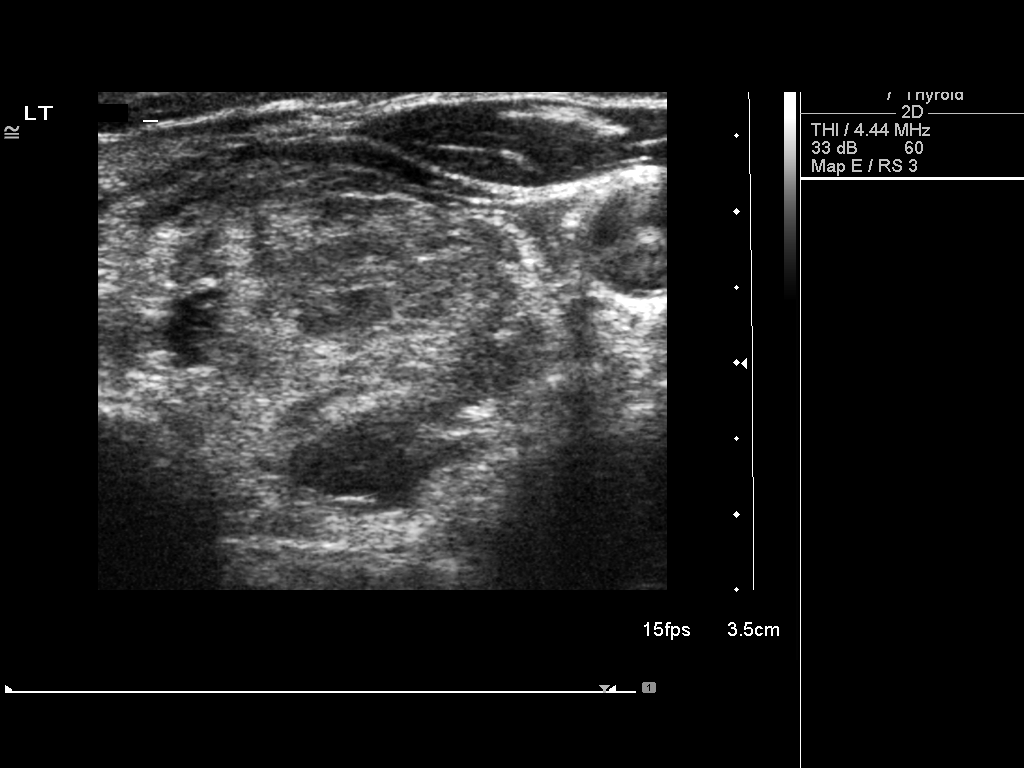

[8 of 8 positions shown; findings below may reference images not displayed]

Thyroid biopsy was thoroughly discussed with the patient and
questions were answered.  The benefits, risks, alternatives, and
complications were also discussed.  The patient understands and
wishes to proceed with the procedure.  Written consent was
obtained.

Ultrasound was performed to localize and mark an adequate site for
the biopsy.  The patient was then prepped and draped in a normal
sterile fashion.  Local anesthesia was provided with 1% lidocaine.
Using direct ultrasound guidance, 3 passes were made using 25 gauge
needles into the nodule within the isthmus lobe of the thyroid.
Ultrasound was used to confirm needle placements on all occasions.
Specimens were sent to Pathology for analysis.

Complications:  No immediate
FINDINGS: Imaging confirms needle placement in the dominant
isthmus nodule
IMPRESSION: Ultrasound guided needle aspirate biopsy performed of the dominant
isthmus thyroid nodule.

ULTRASOUND GUIDED NEEDLE ASPIRATE BIOPSY OF THE LEFT THYROID GLAND
Thyroid biopsy was thoroughly discussed with the patient and
questions were answered.  The benefits, risks, alternatives, and
complications were also discussed.  The patient understands and
wishes to proceed with the procedure.  Written consent was
obtained.

Ultrasound was performed to localize and mark an adequate site for
the biopsy.  The patient was then prepped and draped in a normal
sterile fashion.  Local anesthesia was provided with 1% lidocaine.
Using direct ultrasound guidance, 3 passes were made using 25 gauge
needles into the nodule within the left lobe of the thyroid.
Ultrasound was used to confirm needle placements on all occasions.
Specimens were sent to Pathology for analysis.

Complications:  No immediate
FINDINGS: Imaging confirms needle placed in the dominant left
lower pole thyroid nodule
IMPRESSION: Ultrasound guided needle aspirate biopsy performed of the dominant
left lower pole thyroid nodule.

## 2013-09-12 ENCOUNTER — Encounter (INDEPENDENT_AMBULATORY_CARE_PROVIDER_SITE_OTHER): Payer: Self-pay

## 2014-01-08 ENCOUNTER — Encounter: Payer: Self-pay | Admitting: Obstetrics and Gynecology

## 2016-03-19 DIAGNOSIS — E049 Nontoxic goiter, unspecified: Secondary | ICD-10-CM | POA: Diagnosis not present

## 2016-03-19 DIAGNOSIS — E042 Nontoxic multinodular goiter: Secondary | ICD-10-CM | POA: Diagnosis not present

## 2016-04-15 DIAGNOSIS — Z853 Personal history of malignant neoplasm of breast: Secondary | ICD-10-CM | POA: Diagnosis not present

## 2016-04-15 DIAGNOSIS — Z1231 Encounter for screening mammogram for malignant neoplasm of breast: Secondary | ICD-10-CM | POA: Diagnosis not present

## 2016-04-29 DIAGNOSIS — M79671 Pain in right foot: Secondary | ICD-10-CM | POA: Diagnosis not present

## 2016-04-29 DIAGNOSIS — E042 Nontoxic multinodular goiter: Secondary | ICD-10-CM | POA: Diagnosis not present

## 2016-04-29 DIAGNOSIS — M8580 Other specified disorders of bone density and structure, unspecified site: Secondary | ICD-10-CM | POA: Diagnosis not present

## 2016-04-29 DIAGNOSIS — M79672 Pain in left foot: Secondary | ICD-10-CM | POA: Diagnosis not present

## 2016-04-29 DIAGNOSIS — I10 Essential (primary) hypertension: Secondary | ICD-10-CM | POA: Diagnosis not present

## 2016-04-29 DIAGNOSIS — Z6825 Body mass index (BMI) 25.0-25.9, adult: Secondary | ICD-10-CM | POA: Diagnosis not present

## 2016-05-07 DIAGNOSIS — M205X1 Other deformities of toe(s) (acquired), right foot: Secondary | ICD-10-CM | POA: Diagnosis not present

## 2016-05-07 DIAGNOSIS — M79671 Pain in right foot: Secondary | ICD-10-CM | POA: Diagnosis not present

## 2016-05-07 DIAGNOSIS — M79672 Pain in left foot: Secondary | ICD-10-CM | POA: Diagnosis not present

## 2016-05-07 DIAGNOSIS — M205X2 Other deformities of toe(s) (acquired), left foot: Secondary | ICD-10-CM | POA: Diagnosis not present

## 2016-05-20 DIAGNOSIS — Z87891 Personal history of nicotine dependence: Secondary | ICD-10-CM | POA: Diagnosis not present

## 2016-05-20 DIAGNOSIS — R109 Unspecified abdominal pain: Secondary | ICD-10-CM | POA: Diagnosis not present

## 2016-05-20 DIAGNOSIS — R3915 Urgency of urination: Secondary | ICD-10-CM | POA: Diagnosis not present

## 2016-05-20 DIAGNOSIS — Z8249 Family history of ischemic heart disease and other diseases of the circulatory system: Secondary | ICD-10-CM | POA: Diagnosis not present

## 2016-05-20 DIAGNOSIS — Z6826 Body mass index (BMI) 26.0-26.9, adult: Secondary | ICD-10-CM | POA: Diagnosis not present

## 2016-05-20 DIAGNOSIS — N3010 Interstitial cystitis (chronic) without hematuria: Secondary | ICD-10-CM | POA: Diagnosis not present

## 2016-05-20 DIAGNOSIS — G8918 Other acute postprocedural pain: Secondary | ICD-10-CM | POA: Diagnosis not present

## 2016-05-20 DIAGNOSIS — K5792 Diverticulitis of intestine, part unspecified, without perforation or abscess without bleeding: Secondary | ICD-10-CM | POA: Diagnosis not present

## 2016-05-20 DIAGNOSIS — I10 Essential (primary) hypertension: Secondary | ICD-10-CM | POA: Diagnosis not present

## 2016-05-20 DIAGNOSIS — R339 Retention of urine, unspecified: Secondary | ICD-10-CM | POA: Diagnosis not present

## 2016-05-20 DIAGNOSIS — R350 Frequency of micturition: Secondary | ICD-10-CM | POA: Diagnosis not present

## 2016-05-20 DIAGNOSIS — R1032 Left lower quadrant pain: Secondary | ICD-10-CM | POA: Diagnosis not present

## 2016-05-20 DIAGNOSIS — R110 Nausea: Secondary | ICD-10-CM | POA: Diagnosis not present

## 2016-05-21 DIAGNOSIS — N3010 Interstitial cystitis (chronic) without hematuria: Secondary | ICD-10-CM | POA: Diagnosis not present

## 2016-05-21 DIAGNOSIS — R350 Frequency of micturition: Secondary | ICD-10-CM | POA: Diagnosis not present

## 2016-05-21 DIAGNOSIS — Z6825 Body mass index (BMI) 25.0-25.9, adult: Secondary | ICD-10-CM | POA: Diagnosis not present

## 2016-06-03 DIAGNOSIS — K5732 Diverticulitis of large intestine without perforation or abscess without bleeding: Secondary | ICD-10-CM | POA: Diagnosis not present

## 2016-06-10 DIAGNOSIS — Z6825 Body mass index (BMI) 25.0-25.9, adult: Secondary | ICD-10-CM | POA: Diagnosis not present

## 2016-06-10 DIAGNOSIS — J040 Acute laryngitis: Secondary | ICD-10-CM | POA: Diagnosis not present

## 2016-06-10 DIAGNOSIS — K5792 Diverticulitis of intestine, part unspecified, without perforation or abscess without bleeding: Secondary | ICD-10-CM | POA: Diagnosis not present

## 2016-06-30 DIAGNOSIS — Z86010 Personal history of colonic polyps: Secondary | ICD-10-CM | POA: Diagnosis not present

## 2016-06-30 DIAGNOSIS — K5792 Diverticulitis of intestine, part unspecified, without perforation or abscess without bleeding: Secondary | ICD-10-CM | POA: Diagnosis not present

## 2016-06-30 DIAGNOSIS — K5730 Diverticulosis of large intestine without perforation or abscess without bleeding: Secondary | ICD-10-CM | POA: Diagnosis not present

## 2016-06-30 DIAGNOSIS — Z1211 Encounter for screening for malignant neoplasm of colon: Secondary | ICD-10-CM | POA: Diagnosis not present

## 2016-06-30 DIAGNOSIS — K648 Other hemorrhoids: Secondary | ICD-10-CM | POA: Diagnosis not present

## 2016-06-30 DIAGNOSIS — D122 Benign neoplasm of ascending colon: Secondary | ICD-10-CM | POA: Diagnosis not present

## 2016-07-16 DIAGNOSIS — M205X2 Other deformities of toe(s) (acquired), left foot: Secondary | ICD-10-CM | POA: Diagnosis not present

## 2016-07-21 DIAGNOSIS — E559 Vitamin D deficiency, unspecified: Secondary | ICD-10-CM | POA: Diagnosis not present

## 2016-07-21 DIAGNOSIS — E042 Nontoxic multinodular goiter: Secondary | ICD-10-CM | POA: Diagnosis not present

## 2016-07-22 DIAGNOSIS — K5730 Diverticulosis of large intestine without perforation or abscess without bleeding: Secondary | ICD-10-CM | POA: Diagnosis not present

## 2016-07-22 DIAGNOSIS — E042 Nontoxic multinodular goiter: Secondary | ICD-10-CM | POA: Diagnosis not present

## 2016-07-22 DIAGNOSIS — I10 Essential (primary) hypertension: Secondary | ICD-10-CM | POA: Diagnosis not present

## 2016-07-22 DIAGNOSIS — K5909 Other constipation: Secondary | ICD-10-CM | POA: Diagnosis not present

## 2016-07-22 DIAGNOSIS — E559 Vitamin D deficiency, unspecified: Secondary | ICD-10-CM | POA: Diagnosis not present

## 2016-07-22 DIAGNOSIS — Z6824 Body mass index (BMI) 24.0-24.9, adult: Secondary | ICD-10-CM | POA: Diagnosis not present

## 2016-07-22 DIAGNOSIS — R1310 Dysphagia, unspecified: Secondary | ICD-10-CM | POA: Diagnosis not present

## 2016-07-27 DIAGNOSIS — M205X2 Other deformities of toe(s) (acquired), left foot: Secondary | ICD-10-CM | POA: Diagnosis not present

## 2016-07-27 DIAGNOSIS — M205X1 Other deformities of toe(s) (acquired), right foot: Secondary | ICD-10-CM | POA: Diagnosis not present

## 2016-08-20 DIAGNOSIS — M205X2 Other deformities of toe(s) (acquired), left foot: Secondary | ICD-10-CM | POA: Diagnosis not present

## 2016-12-28 DIAGNOSIS — I10 Essential (primary) hypertension: Secondary | ICD-10-CM | POA: Diagnosis not present

## 2016-12-28 DIAGNOSIS — G43109 Migraine with aura, not intractable, without status migrainosus: Secondary | ICD-10-CM | POA: Diagnosis not present

## 2016-12-28 DIAGNOSIS — Z23 Encounter for immunization: Secondary | ICD-10-CM | POA: Diagnosis not present

## 2017-01-12 DIAGNOSIS — J040 Acute laryngitis: Secondary | ICD-10-CM | POA: Diagnosis not present

## 2017-01-12 DIAGNOSIS — J069 Acute upper respiratory infection, unspecified: Secondary | ICD-10-CM | POA: Diagnosis not present

## 2017-01-18 DIAGNOSIS — J0100 Acute maxillary sinusitis, unspecified: Secondary | ICD-10-CM | POA: Diagnosis not present

## 2017-01-20 DIAGNOSIS — E559 Vitamin D deficiency, unspecified: Secondary | ICD-10-CM | POA: Diagnosis not present

## 2017-01-20 DIAGNOSIS — E042 Nontoxic multinodular goiter: Secondary | ICD-10-CM | POA: Diagnosis not present

## 2017-01-22 DIAGNOSIS — E042 Nontoxic multinodular goiter: Secondary | ICD-10-CM | POA: Diagnosis not present

## 2017-01-22 DIAGNOSIS — E559 Vitamin D deficiency, unspecified: Secondary | ICD-10-CM | POA: Diagnosis not present

## 2017-01-22 DIAGNOSIS — I10 Essential (primary) hypertension: Secondary | ICD-10-CM | POA: Diagnosis not present

## 2017-02-09 DIAGNOSIS — E042 Nontoxic multinodular goiter: Secondary | ICD-10-CM | POA: Diagnosis not present

## 2017-03-24 DIAGNOSIS — R0989 Other specified symptoms and signs involving the circulatory and respiratory systems: Secondary | ICD-10-CM | POA: Diagnosis not present

## 2017-03-24 DIAGNOSIS — Z87891 Personal history of nicotine dependence: Secondary | ICD-10-CM | POA: Diagnosis not present

## 2017-03-24 DIAGNOSIS — E041 Nontoxic single thyroid nodule: Secondary | ICD-10-CM | POA: Diagnosis not present

## 2017-03-25 DIAGNOSIS — E041 Nontoxic single thyroid nodule: Secondary | ICD-10-CM | POA: Diagnosis not present

## 2017-04-16 DIAGNOSIS — E041 Nontoxic single thyroid nodule: Secondary | ICD-10-CM | POA: Diagnosis not present

## 2017-04-16 DIAGNOSIS — R490 Dysphonia: Secondary | ICD-10-CM | POA: Diagnosis not present

## 2017-04-16 DIAGNOSIS — K219 Gastro-esophageal reflux disease without esophagitis: Secondary | ICD-10-CM | POA: Diagnosis not present

## 2017-05-17 DIAGNOSIS — E041 Nontoxic single thyroid nodule: Secondary | ICD-10-CM | POA: Diagnosis not present

## 2017-05-17 DIAGNOSIS — E042 Nontoxic multinodular goiter: Secondary | ICD-10-CM | POA: Diagnosis not present

## 2017-06-22 DIAGNOSIS — E041 Nontoxic single thyroid nodule: Secondary | ICD-10-CM | POA: Diagnosis not present

## 2017-06-28 DIAGNOSIS — Z1231 Encounter for screening mammogram for malignant neoplasm of breast: Secondary | ICD-10-CM | POA: Diagnosis not present

## 2017-06-28 DIAGNOSIS — G43109 Migraine with aura, not intractable, without status migrainosus: Secondary | ICD-10-CM | POA: Diagnosis not present

## 2017-06-28 DIAGNOSIS — I10 Essential (primary) hypertension: Secondary | ICD-10-CM | POA: Diagnosis not present

## 2017-06-28 DIAGNOSIS — M8580 Other specified disorders of bone density and structure, unspecified site: Secondary | ICD-10-CM | POA: Diagnosis not present

## 2017-07-26 DIAGNOSIS — Z1231 Encounter for screening mammogram for malignant neoplasm of breast: Secondary | ICD-10-CM | POA: Diagnosis not present

## 2017-08-23 DIAGNOSIS — R5383 Other fatigue: Secondary | ICD-10-CM | POA: Diagnosis not present

## 2017-08-23 DIAGNOSIS — Z9889 Other specified postprocedural states: Secondary | ICD-10-CM | POA: Diagnosis not present

## 2017-08-23 DIAGNOSIS — M8580 Other specified disorders of bone density and structure, unspecified site: Secondary | ICD-10-CM | POA: Diagnosis not present

## 2017-08-23 DIAGNOSIS — E559 Vitamin D deficiency, unspecified: Secondary | ICD-10-CM | POA: Diagnosis not present

## 2017-09-13 DIAGNOSIS — M8589 Other specified disorders of bone density and structure, multiple sites: Secondary | ICD-10-CM | POA: Diagnosis not present

## 2017-09-13 DIAGNOSIS — M810 Age-related osteoporosis without current pathological fracture: Secondary | ICD-10-CM | POA: Diagnosis not present

## 2017-10-12 DIAGNOSIS — M810 Age-related osteoporosis without current pathological fracture: Secondary | ICD-10-CM | POA: Diagnosis not present

## 2017-10-21 ENCOUNTER — Encounter: Payer: Self-pay | Admitting: Genetics

## 2017-12-28 DIAGNOSIS — M810 Age-related osteoporosis without current pathological fracture: Secondary | ICD-10-CM | POA: Diagnosis not present

## 2017-12-28 DIAGNOSIS — I10 Essential (primary) hypertension: Secondary | ICD-10-CM | POA: Diagnosis not present

## 2018-02-06 DIAGNOSIS — J209 Acute bronchitis, unspecified: Secondary | ICD-10-CM | POA: Diagnosis not present

## 2018-05-04 DIAGNOSIS — J069 Acute upper respiratory infection, unspecified: Secondary | ICD-10-CM | POA: Diagnosis not present

## 2018-05-04 DIAGNOSIS — J019 Acute sinusitis, unspecified: Secondary | ICD-10-CM | POA: Diagnosis not present

## 2018-08-04 DIAGNOSIS — H0100A Unspecified blepharitis right eye, upper and lower eyelids: Secondary | ICD-10-CM | POA: Diagnosis not present

## 2018-08-04 DIAGNOSIS — Z961 Presence of intraocular lens: Secondary | ICD-10-CM | POA: Diagnosis not present

## 2018-08-04 DIAGNOSIS — D23122 Other benign neoplasm of skin of left lower eyelid, including canthus: Secondary | ICD-10-CM | POA: Diagnosis not present

## 2018-08-04 DIAGNOSIS — H1131 Conjunctival hemorrhage, right eye: Secondary | ICD-10-CM | POA: Diagnosis not present

## 2018-08-04 DIAGNOSIS — H0100B Unspecified blepharitis left eye, upper and lower eyelids: Secondary | ICD-10-CM | POA: Diagnosis not present

## 2018-09-14 DIAGNOSIS — Z1231 Encounter for screening mammogram for malignant neoplasm of breast: Secondary | ICD-10-CM | POA: Diagnosis not present

## 2018-09-14 DIAGNOSIS — Z853 Personal history of malignant neoplasm of breast: Secondary | ICD-10-CM | POA: Diagnosis not present

## 2018-10-05 DIAGNOSIS — Z9889 Other specified postprocedural states: Secondary | ICD-10-CM | POA: Diagnosis not present

## 2018-10-05 DIAGNOSIS — Z8639 Personal history of other endocrine, nutritional and metabolic disease: Secondary | ICD-10-CM | POA: Diagnosis not present

## 2018-10-05 DIAGNOSIS — M810 Age-related osteoporosis without current pathological fracture: Secondary | ICD-10-CM | POA: Diagnosis not present

## 2018-10-05 DIAGNOSIS — I10 Essential (primary) hypertension: Secondary | ICD-10-CM | POA: Diagnosis not present

## 2018-10-10 DIAGNOSIS — Z4889 Encounter for other specified surgical aftercare: Secondary | ICD-10-CM | POA: Diagnosis not present

## 2018-10-10 DIAGNOSIS — Z8639 Personal history of other endocrine, nutritional and metabolic disease: Secondary | ICD-10-CM | POA: Diagnosis not present

## 2018-10-10 DIAGNOSIS — Z9889 Other specified postprocedural states: Secondary | ICD-10-CM | POA: Diagnosis not present

## 2018-10-10 DIAGNOSIS — E890 Postprocedural hypothyroidism: Secondary | ICD-10-CM | POA: Diagnosis not present

## 2018-10-10 DIAGNOSIS — E041 Nontoxic single thyroid nodule: Secondary | ICD-10-CM | POA: Diagnosis not present

## 2018-11-29 DIAGNOSIS — N3000 Acute cystitis without hematuria: Secondary | ICD-10-CM | POA: Diagnosis not present

## 2018-11-29 DIAGNOSIS — Z23 Encounter for immunization: Secondary | ICD-10-CM | POA: Diagnosis not present

## 2019-01-03 DIAGNOSIS — R112 Nausea with vomiting, unspecified: Secondary | ICD-10-CM | POA: Diagnosis not present

## 2019-01-03 DIAGNOSIS — R197 Diarrhea, unspecified: Secondary | ICD-10-CM | POA: Diagnosis not present

## 2019-01-03 DIAGNOSIS — R1084 Generalized abdominal pain: Secondary | ICD-10-CM | POA: Diagnosis not present

## 2019-01-10 DIAGNOSIS — R197 Diarrhea, unspecified: Secondary | ICD-10-CM | POA: Diagnosis not present

## 2019-01-10 DIAGNOSIS — R1110 Vomiting, unspecified: Secondary | ICD-10-CM | POA: Diagnosis not present

## 2019-01-10 DIAGNOSIS — R112 Nausea with vomiting, unspecified: Secondary | ICD-10-CM | POA: Diagnosis not present

## 2019-01-10 DIAGNOSIS — R1084 Generalized abdominal pain: Secondary | ICD-10-CM | POA: Diagnosis not present

## 2019-01-17 DIAGNOSIS — J209 Acute bronchitis, unspecified: Secondary | ICD-10-CM | POA: Diagnosis not present

## 2019-02-27 DIAGNOSIS — R112 Nausea with vomiting, unspecified: Secondary | ICD-10-CM | POA: Diagnosis not present

## 2019-02-27 DIAGNOSIS — K582 Mixed irritable bowel syndrome: Secondary | ICD-10-CM | POA: Diagnosis not present

## 2019-02-27 DIAGNOSIS — R197 Diarrhea, unspecified: Secondary | ICD-10-CM | POA: Diagnosis not present

## 2019-02-27 DIAGNOSIS — R1084 Generalized abdominal pain: Secondary | ICD-10-CM | POA: Diagnosis not present

## 2019-06-23 DIAGNOSIS — Z1159 Encounter for screening for other viral diseases: Secondary | ICD-10-CM | POA: Diagnosis not present

## 2019-06-23 DIAGNOSIS — I10 Essential (primary) hypertension: Secondary | ICD-10-CM | POA: Diagnosis not present

## 2019-06-23 DIAGNOSIS — E041 Nontoxic single thyroid nodule: Secondary | ICD-10-CM | POA: Diagnosis not present

## 2019-06-23 DIAGNOSIS — I700 Atherosclerosis of aorta: Secondary | ICD-10-CM | POA: Diagnosis not present

## 2019-06-23 DIAGNOSIS — N289 Disorder of kidney and ureter, unspecified: Secondary | ICD-10-CM | POA: Diagnosis not present

## 2019-09-25 DIAGNOSIS — H6501 Acute serous otitis media, right ear: Secondary | ICD-10-CM | POA: Diagnosis not present

## 2019-09-25 DIAGNOSIS — J0140 Acute pansinusitis, unspecified: Secondary | ICD-10-CM | POA: Diagnosis not present

## 2019-09-27 DIAGNOSIS — Z1231 Encounter for screening mammogram for malignant neoplasm of breast: Secondary | ICD-10-CM | POA: Diagnosis not present

## 2019-11-02 DIAGNOSIS — I10 Essential (primary) hypertension: Secondary | ICD-10-CM | POA: Diagnosis not present

## 2020-01-19 DIAGNOSIS — H524 Presbyopia: Secondary | ICD-10-CM | POA: Diagnosis not present

## 2020-02-29 DIAGNOSIS — N289 Disorder of kidney and ureter, unspecified: Secondary | ICD-10-CM | POA: Diagnosis not present

## 2020-02-29 DIAGNOSIS — I700 Atherosclerosis of aorta: Secondary | ICD-10-CM | POA: Diagnosis not present

## 2020-02-29 DIAGNOSIS — Z23 Encounter for immunization: Secondary | ICD-10-CM | POA: Diagnosis not present

## 2020-02-29 DIAGNOSIS — I10 Essential (primary) hypertension: Secondary | ICD-10-CM | POA: Diagnosis not present

## 2020-05-11 ENCOUNTER — Emergency Department (HOSPITAL_BASED_OUTPATIENT_CLINIC_OR_DEPARTMENT_OTHER): Payer: Medicare Other

## 2020-05-11 ENCOUNTER — Encounter (HOSPITAL_BASED_OUTPATIENT_CLINIC_OR_DEPARTMENT_OTHER): Payer: Self-pay | Admitting: Emergency Medicine

## 2020-05-11 ENCOUNTER — Emergency Department (HOSPITAL_BASED_OUTPATIENT_CLINIC_OR_DEPARTMENT_OTHER)
Admission: EM | Admit: 2020-05-11 | Discharge: 2020-05-11 | Disposition: A | Discharge status: Home / Self Care | Payer: Medicare Other | Attending: Emergency Medicine | Admitting: Emergency Medicine

## 2020-05-11 ENCOUNTER — Other Ambulatory Visit: Payer: Self-pay

## 2020-05-11 DIAGNOSIS — Z853 Personal history of malignant neoplasm of breast: Secondary | ICD-10-CM | POA: Diagnosis not present

## 2020-05-11 DIAGNOSIS — Z79899 Other long term (current) drug therapy: Secondary | ICD-10-CM | POA: Insufficient documentation

## 2020-05-11 DIAGNOSIS — R197 Diarrhea, unspecified: Secondary | ICD-10-CM

## 2020-05-11 DIAGNOSIS — I10 Essential (primary) hypertension: Secondary | ICD-10-CM | POA: Insufficient documentation

## 2020-05-11 DIAGNOSIS — R110 Nausea: Secondary | ICD-10-CM | POA: Diagnosis not present

## 2020-05-11 DIAGNOSIS — Z87891 Personal history of nicotine dependence: Secondary | ICD-10-CM | POA: Insufficient documentation

## 2020-05-11 DIAGNOSIS — R1031 Right lower quadrant pain: Secondary | ICD-10-CM | POA: Insufficient documentation

## 2020-05-11 HISTORY — DX: Diverticulitis of intestine, part unspecified, without perforation or abscess without bleeding: K57.92

## 2020-05-11 LAB — CBC WITH DIFFERENTIAL/PLATELET
Abs Immature Granulocytes: 0.02 10*3/uL (ref 0.00–0.07)
Basophils Absolute: 0.1 10*3/uL (ref 0.0–0.1)
Basophils Relative: 1 %
Eosinophils Absolute: 0.1 10*3/uL (ref 0.0–0.5)
Eosinophils Relative: 1 %
HCT: 43.4 % (ref 36.0–46.0)
Hemoglobin: 14.2 g/dL (ref 12.0–15.0)
Immature Granulocytes: 0 %
Lymphocytes Relative: 24 %
Lymphs Abs: 2 10*3/uL (ref 0.7–4.0)
MCH: 30.4 pg (ref 26.0–34.0)
MCHC: 32.7 g/dL (ref 30.0–36.0)
MCV: 92.9 fL (ref 80.0–100.0)
Monocytes Absolute: 0.5 10*3/uL (ref 0.1–1.0)
Monocytes Relative: 6 %
Neutro Abs: 5.8 10*3/uL (ref 1.7–7.7)
Neutrophils Relative %: 68 %
Platelets: 269 10*3/uL (ref 150–400)
RBC: 4.67 MIL/uL (ref 3.87–5.11)
RDW: 12.5 % (ref 11.5–15.5)
WBC: 8.5 10*3/uL (ref 4.0–10.5)
nRBC: 0 % (ref 0.0–0.2)

## 2020-05-11 LAB — URINALYSIS, ROUTINE W REFLEX MICROSCOPIC

## 2020-05-11 LAB — URINALYSIS, MICROSCOPIC (REFLEX)

## 2020-05-11 LAB — BASIC METABOLIC PANEL
Anion gap: 10 (ref 5–15)
BUN: 23 mg/dL (ref 8–23)
CO2: 27 mmol/L (ref 22–32)
Calcium: 9.3 mg/dL (ref 8.9–10.3)
Chloride: 102 mmol/L (ref 98–111)
Creatinine, Ser: 0.73 mg/dL (ref 0.44–1.00)
GFR, Estimated: >60 mL/min (ref >60)
Glucose, Bld: 163 mg/dL — ABNORMAL HIGH (ref 70–99)
Potassium: 3.3 mmol/L — ABNORMAL LOW (ref 3.5–5.1)
Sodium: 139 mmol/L (ref 135–145)

## 2020-05-11 MED ORDER — ONDANSETRON HCL 4 MG PO TABS: 4.0000 mg | ORAL_TABLET | Freq: Three times a day (TID) | ORAL | 0 refills | Status: AC | PRN

## 2020-05-11 MED ORDER — ONDANSETRON HCL 4 MG/2ML IJ SOLN
4.0000 mg | Freq: Once | INTRAMUSCULAR | Status: AC
  Administered 2020-05-11: 4 mg via INTRAVENOUS
  Filled 2020-05-11: qty 2

## 2020-05-11 MED ORDER — DICYCLOMINE HCL 20 MG PO TABS: 20.0000 mg | ORAL_TABLET | Freq: Two times a day (BID) | ORAL | 0 refills | Status: AC

## 2020-05-11 MED ORDER — KETOROLAC TROMETHAMINE 30 MG/ML IJ SOLN
15.0000 mg | Freq: Once | INTRAMUSCULAR | Status: AC
  Administered 2020-05-11: 15 mg via INTRAVENOUS
  Filled 2020-05-11: qty 1

## 2020-05-11 MED ORDER — DICYCLOMINE HCL 10 MG PO CAPS
10.0000 mg | ORAL_CAPSULE | Freq: Once | ORAL | Status: AC
  Administered 2020-05-11: 10 mg via ORAL
  Filled 2020-05-11: qty 1

## 2020-05-11 NOTE — ED Triage Notes (Signed)
Pt c/o RLQ abd pain with nausea and diarrhea.

## 2020-05-11 NOTE — ED Provider Notes (Signed)
[facility] EMERGENCY DEPARTMENT Provider Note   CSN: [id number] Arrival date & time: 05/11/20  1324     History No chief complaint on file.   Paige Spencer is a 70 y.o. female.  The history is provided by the patient.  Abdominal Pain Pain location:  Suprapubic Pain quality: cramping   Pain radiates to:  Does not radiate Pain severity:  Severe Onset quality:  Gradual Duration:  3 weeks Timing:  Constant Progression:  Waxing and waning Chronicity:  New Context: not sick contacts and not trauma   Relieved by:  Nothing Worsened by:  Nothing Ineffective treatments:  None tried Associated symptoms: diarrhea   Associated symptoms: no chest pain, no fever, no shortness of breath and no vomiting   Associated symptoms comment:  Tonight has nausea and 2 episodes of diarrhea  Risk factors: being elderly   Patient with interstitial cystitis who presents with 3 weeks of lower abdominal pain. No n/v/d prior to tonight.  Tonight had 2 episodes of diarrhea.  No f/c/r.      Past Medical History:  Diagnosis Date  . Atrophic vaginitis   . Cancer ([place]) 1996   Breast Cancer-radiation-lumpectomy  . Diverticulitis   . DUB (dysfunctional uterine bleeding)   . Endometrial polyp   . Hypertension   . IC (interstitial cystitis)   . Migraines   . Osteopenia   . Thyroid disease    Goiter    Patient Active Problem List   Diagnosis Date Noted  . 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 Breast cancer in situ 11/12/2010  . Endometrial polyp   . Atrophic vaginitis   . Osteopenia   . DUB (dysfunctional uterine bleeding)   . IC (interstitial cystitis)   . Thyroid disease   . Migraines   . Hypertension     Past Surgical History:  Procedure Laterality Date  . BREAST SURGERY     Lumpectomy-reconstruction  . CESAREAN SECTION    . DILATION AND CURETTAGE OF UTERUS  2000  . TUBAL LIGATION    . VAGINAL HYSTERECTOMY  2003     OB History    Gravida  1   Para  1   Term  1    Preterm      AB      Living  1     SAB      IAB      Ectopic      Multiple      Live Births              Family History  Problem Relation Age of Onset  . Diabetes Mother   . Hypertension Mother   . Breast cancer Mother        Age 47  . Heart disease Father   . Breast cancer Sister        Age 56  . Diabetes Brother   . Hypertension Brother   . Heart disease Brother     Social History   Tobacco Use  . Smoking status: Former Research scientist (life sciences)  . Smokeless tobacco: Never Used  . Tobacco comment: social smoker  Substance Use Topics  . Alcohol use: Yes    Alcohol/week: 4.0 standard drinks    Types: 4 Standard drinks or equivalent per week    Comment: wine  . Drug use: No    Home Medications Prior to Admission medications   Medication Sig Start Date End Date Taking? Authorizing Provider  amLODipine (NORVASC) 5 MG tablet Take 5 mg by  mouth daily.      [provider]  DULoxetine (CYMBALTA) 60 MG capsule Take 60 mg by mouth daily.      [provider]  topiramate (TOPAMAX) 100 MG tablet Take 100 mg by mouth daily.      [provider]  UNABLE TO FIND Hyalo vag. gel    [provider]    Allergies    Patient has no known allergies.  Review of Systems   Review of Systems  Constitutional: Negative for fever.  HENT: Negative for congestion.   Eyes: Negative for visual disturbance.  Respiratory: Negative for shortness of breath.   Cardiovascular: Negative for chest pain.  Gastrointestinal: Positive for abdominal pain and diarrhea. Negative for vomiting.  Genitourinary: Negative for flank pain.  Musculoskeletal: Negative for arthralgias.  Skin: Negative for rash.  Neurological: Negative for dizziness.  Psychiatric/Behavioral: Negative for agitation.  All other systems reviewed and are negative.   Physical Exam Updated Vital Signs BP 128/76 (BP Location: Right Arm)   Pulse 79   Temp 98 F (36.7 C) (Oral)   Resp 18   Ht 5'  5" (1.651 m)   Wt 71.7 kg   LMP 03/09/2001   SpO2 93%   BMI 26.29 kg/m   Physical Exam Vitals and nursing note reviewed.  Constitutional:      General: She is not in acute distress.    Appearance: Normal appearance.  HENT:     Head: Normocephalic and atraumatic.     Nose: Nose normal.  Eyes:     Conjunctiva/sclera: Conjunctivae normal.     Pupils: Pupils are equal, round, and reactive to light.  Cardiovascular:     Rate and Rhythm: Normal rate and regular rhythm.     Pulses: Normal pulses.     Heart sounds: Normal heart sounds.  Pulmonary:     Effort: Pulmonary effort is normal.     Breath sounds: Normal breath sounds.  Abdominal:     General: Abdomen is flat. Bowel sounds are normal.     Palpations: Abdomen is soft.     Tenderness: There is no abdominal tenderness. There is no guarding or rebound.  Musculoskeletal:        General: Normal range of motion.     Cervical back: Normal range of motion and neck supple.  Skin:    General: Skin is warm and dry.     Capillary Refill: Capillary refill takes less than 2 seconds.  Neurological:     General: No focal deficit present.     Mental Status: She is alert and oriented to person, place, and time.     Deep Tendon Reflexes: Reflexes normal.  Psychiatric:        Mood and Affect: Mood normal.        Behavior: Behavior normal.     ED Results / Procedures / Treatments   Labs (all labs ordered are listed, but only abnormal results are displayed) Labs Reviewed  URINALYSIS, ROUTINE W REFLEX MICROSCOPIC - Abnormal; Notable for the following components:      Result Value   Color, Urine ORANGE (*)    APPearance TURBID (*)    Glucose, UA   (*)    Value: TEST NOT REPORTED DUE TO COLOR INTERFERENCE OF URINE PIGMENT   Hgb urine dipstick   (*)    Value: TEST NOT REPORTED DUE TO COLOR INTERFERENCE OF URINE PIGMENT   Bilirubin Urine   (*)    Value: TEST NOT REPORTED DUE TO  COLOR INTERFERENCE OF URINE PIGMENT   Ketones, ur   (*)     Value: TEST NOT REPORTED DUE TO COLOR INTERFERENCE OF URINE PIGMENT   Protein, ur   (*)    Value: TEST NOT REPORTED DUE TO COLOR INTERFERENCE OF URINE PIGMENT   Nitrite   (*)    Value: TEST NOT REPORTED DUE TO COLOR INTERFERENCE OF URINE PIGMENT   Leukocytes,Ua   (*)    Value: TEST NOT REPORTED DUE TO COLOR INTERFERENCE OF URINE PIGMENT   All other components within normal limits  URINALYSIS, MICROSCOPIC (REFLEX) - Abnormal; Notable for the following components:   Bacteria, UA RARE (*)    All other components within normal limits  CBC WITH DIFFERENTIAL/PLATELET  BASIC METABOLIC PANEL    EKG None  Radiology No results found.  Procedures Procedures   Medications Ordered in ED Medications  ketorolac (TORADOL) 30 MG/ML injection 15 mg (has no administration in time range)  dicyclomine (BENTYL) capsule 10 mg (has no administration in time range)  ondansetron (ZOFRAN) injection 4 mg (4 mg Intravenous Given 05/11/20 0425)    ED Course  I have reviewed the triage vital signs and the nursing notes.  Pertinent labs & imaging results that were available during my care of the patient were reviewed by me and considered in my medical decision making (see chart for details).    CT is normal as are labs.  Urine has an interferring substance which is the pyridium.  Start tylenol for pain.  Will add Po bentyl.  Follow up with your PMD.  Stable for discharge with close follow up.    ROBBYN HODKINSON was evaluated in Emergency Department on 05/11/2020 for the symptoms described in the history of present illness. She was evaluated in the context of the global COVID-19 pandemic, which necessitated consideration that the patient might be at risk for infection with the SARS-CoV-2 virus that causes COVID-19. Institutional protocols and algorithms that pertain to the evaluation of patients at risk for COVID-19 are in a state of rapid change based on information released by regulatory bodies including the  CDC and federal and state organizations. These policies and algorithms were followed during the patient's care in the ED.  Final Clinical Impression(s) / ED Diagnoses  Return for intractable cough, coughing up blood, fevers >100.4 unrelieved by medication, shortness of breath, intractable vomiting, chest pain, shortness of breath, weakness, numbness, changes in speech, facial asymmetry, abdominal pain, passing out, Inability to tolerate liquids or food, cough, altered mental status or any concerns. No signs of systemic illness or infection. The patient is nontoxic-appearing on exam and vital signs are within normal limits.  I have reviewed the triage vital signs and the nursing notes. Pertinent labs & imaging results that were available during my care of the patient were reviewed by me and considered in my medical decision making (see chart for details). After history, exam, and medical workup I feel the patient has been appropriately medically screened and is safe for discharge home. Pertinent diagnoses were discussed with the patient. Patient was given return precautions.    Khyrie Masi, MD 05/11/20 [id number]

## 2021-03-26 IMAGING — CT CT RENAL STONE PROTOCOL
2 of 4 series · 17 of 46 positions shown, 19 images · non-contrast
Comparison: 01/10/2019

CLINICAL DATA: Right lower quadrant pain with nausea and diarrhea

EXAM:
CT ABDOMEN AND PELVIS WITHOUT CONTRAST
TECHNIQUE: Multidetector CT imaging of the abdomen and pelvis was performed
following the standard protocol without IV contrast.

[Series 2: axial st · axial · 0.66mm/px · z∈[-428,-32]mm · 14 of 87 slices shown, 16 images]
[im 4/87  soft-tissue]
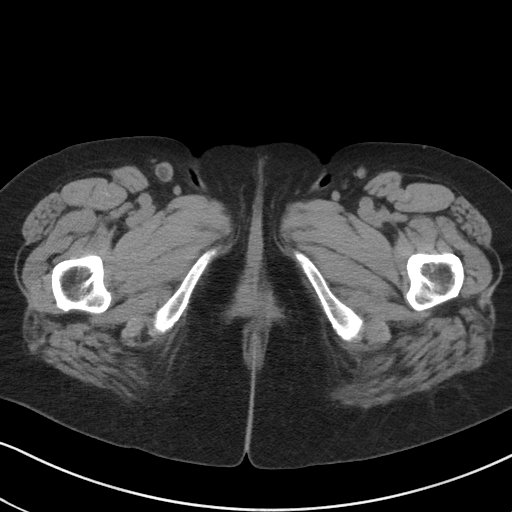
[im 4/87  bone]
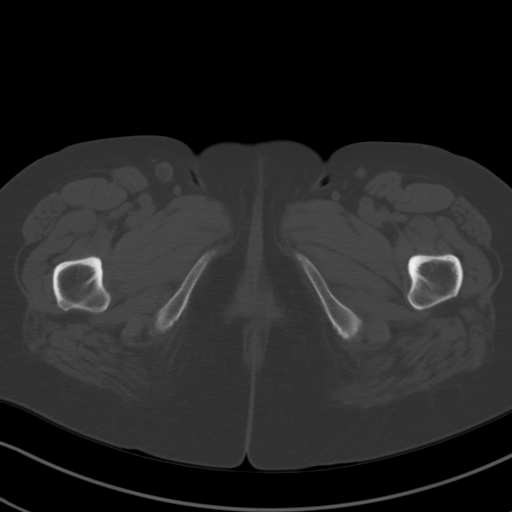
[im 10/87  soft-tissue]
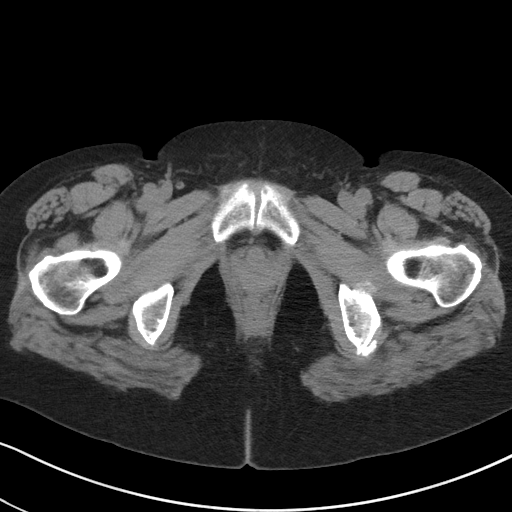
[im 17/87  soft-tissue]
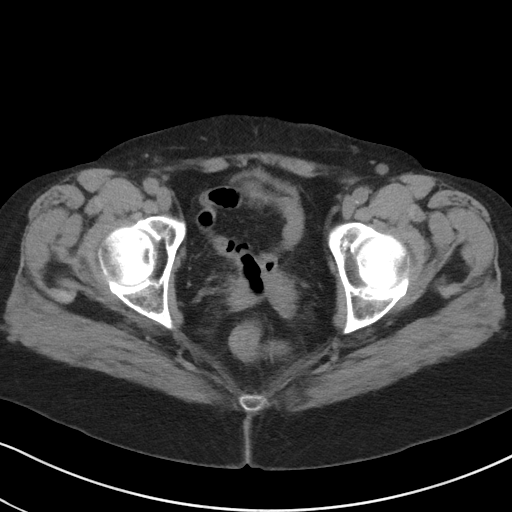
[im 24/87  soft-tissue]
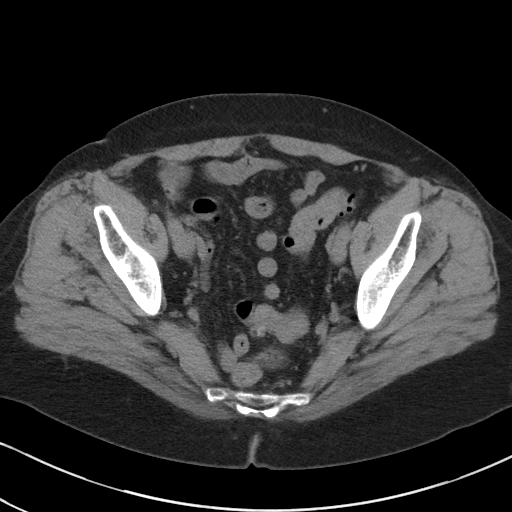
[im 30/87  soft-tissue]
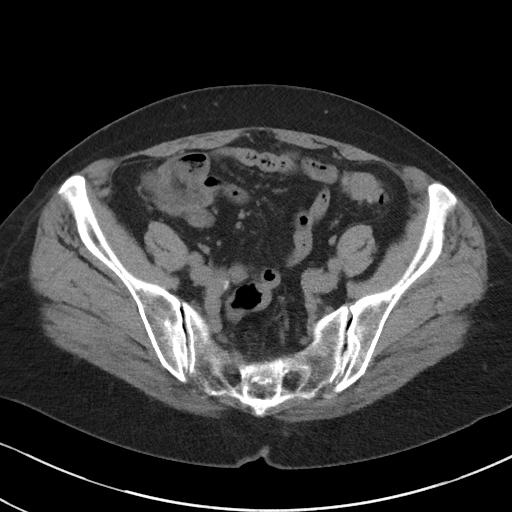
[im 34/87  soft-tissue]
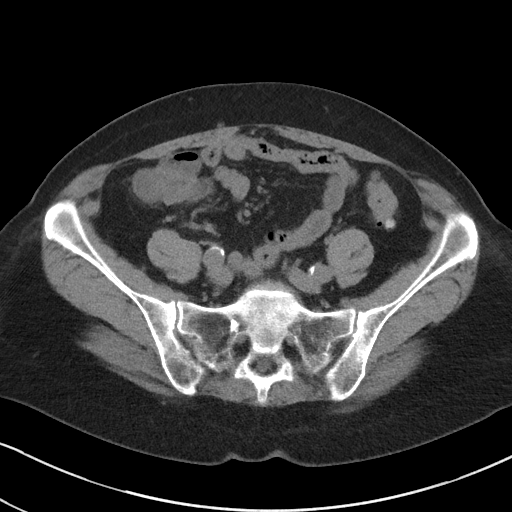
[im 40/87  soft-tissue]
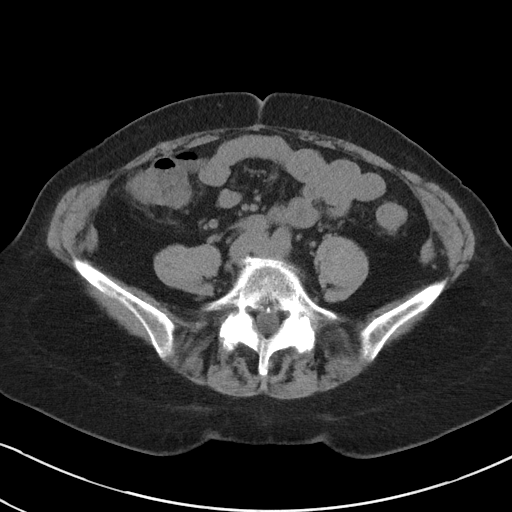
[im 47/87  soft-tissue]
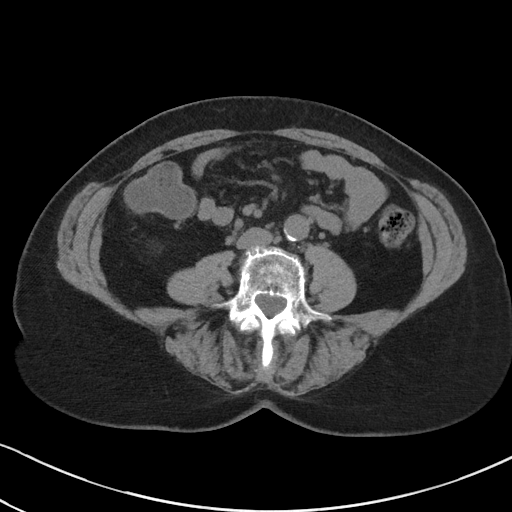
[im 53/87  soft-tissue]
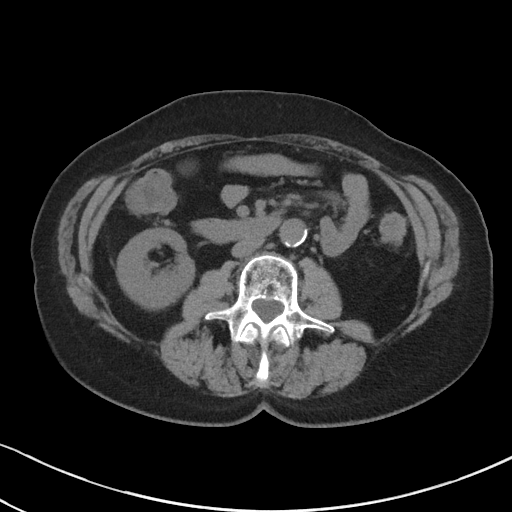
[im 53/87  bone]
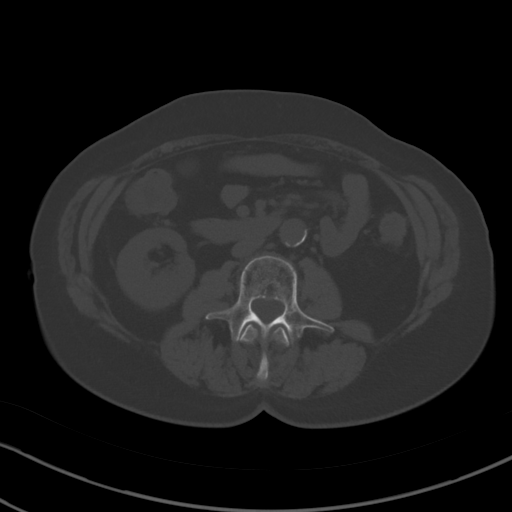
[im 57/87  soft-tissue]
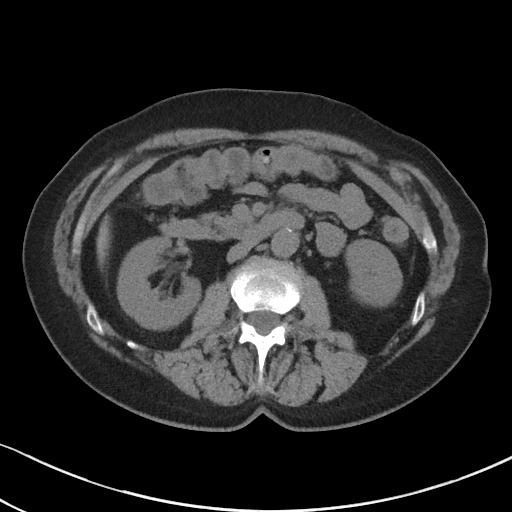
[im 63/87  soft-tissue]
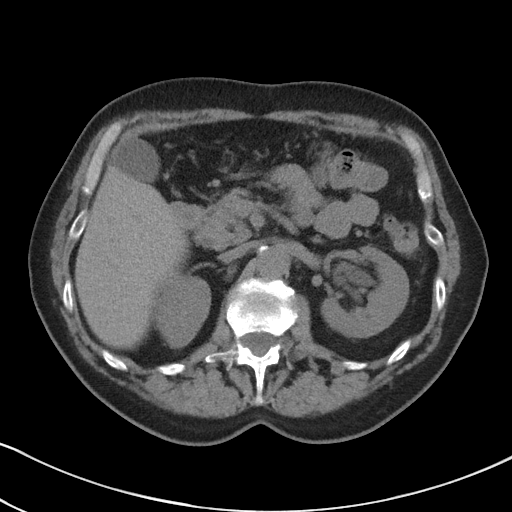
[im 70/87  soft-tissue]
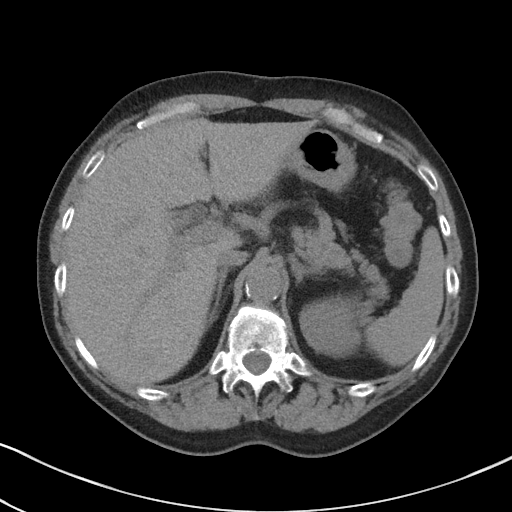
[im 77/87  soft-tissue]
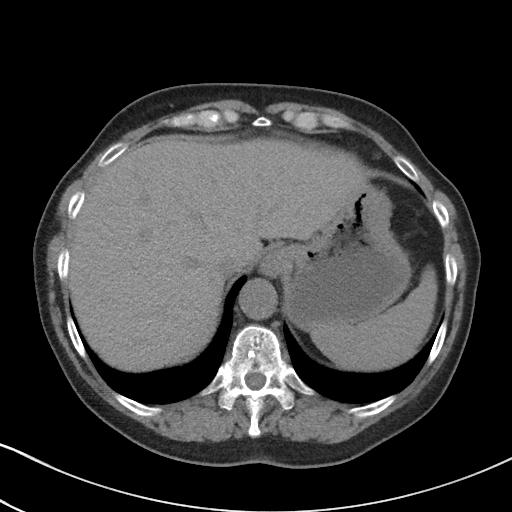
[im 83/87  soft-tissue]
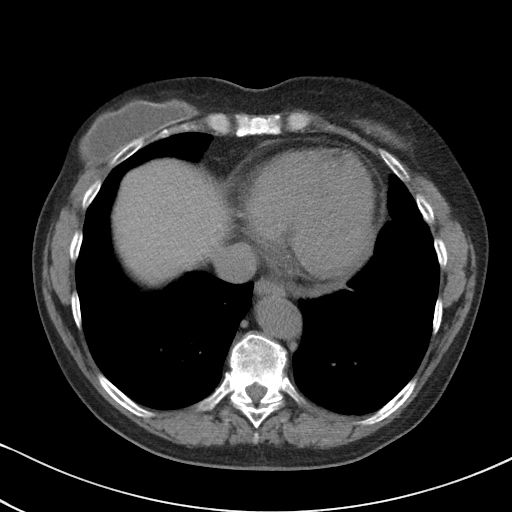

[Series 4: coronal st · coronal · 0.82mm/px · 3 of 84 slices shown]
[im 28/84  soft-tissue]
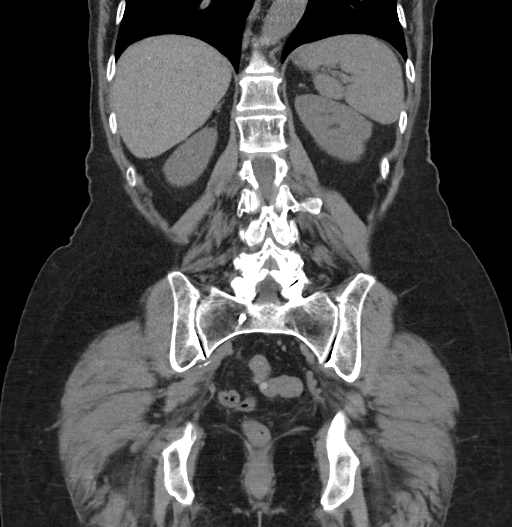
[im 37/84  soft-tissue]
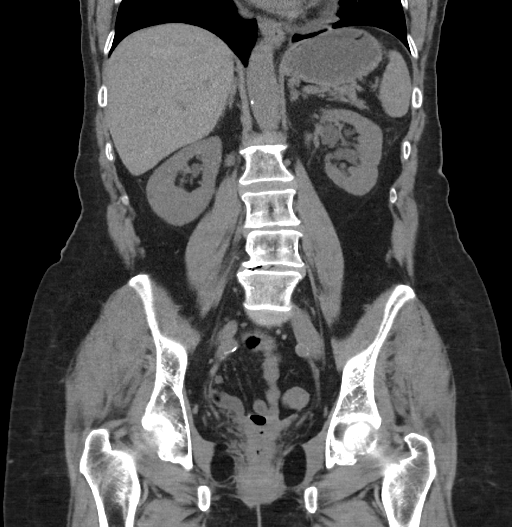
[im 47/84  soft-tissue]
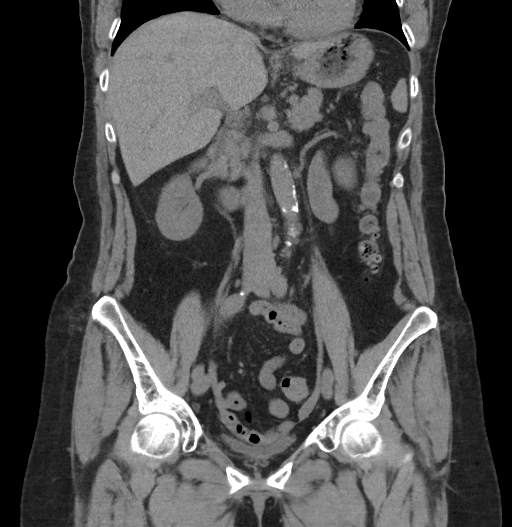

[17 of 46 positions shown; findings below may reference images not displayed]

FINDINGS: Lower chest:  Trace pericardial fluid.

Breast implants. The right implant appears less tense than
previously, but only minimally covered.

Hepatobiliary: Vague low-density in the posterior right liver which
is stable from prior.No evidence of biliary obstruction or stone.

Pancreas: Unremarkable.

Spleen: Unremarkable.

Adrenals/Urinary Tract: Negative adrenals. No hydronephrosis or
stone. Left renal sinus cysts. Unremarkable bladder.

Stomach/Bowel: No obstruction. No appendicitis. Scattered colonic
diverticulosis.

Vascular/Lymphatic: No acute vascular abnormality. No mass or
adenopathy.

Reproductive:Hysterectomy.

Other: No ascites or pneumoperitoneum.

Musculoskeletal: Ordinary degenerative changes without acute
finding.
IMPRESSION: 1. No acute finding.
2. Colonic diverticulosis.

## 2021-10-06 ENCOUNTER — Ambulatory Visit (INDEPENDENT_AMBULATORY_CARE_PROVIDER_SITE_OTHER): Payer: Medicare Other

## 2021-10-06 ENCOUNTER — Ambulatory Visit: Payer: Medicare Other | Admitting: Podiatry

## 2021-10-06 ENCOUNTER — Ambulatory Visit: Payer: Medicare Other

## 2021-10-06 DIAGNOSIS — M205X1 Other deformities of toe(s) (acquired), right foot: Secondary | ICD-10-CM | POA: Diagnosis not present

## 2021-10-06 NOTE — Progress Notes (Signed)
Chief Complaint  Patient presents with   Foot Pain    bil foot pain/ interested in laproscopy surgery/ had one previously on left foot     HPI: 71 y.o. 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  Patient states that she does have a history of left great toe arthrodesis a few years prior.  She does not like the surgical outcome and the inability to move the great toe joint.  It affects her daily activities especially going up and down stairs and with certain motions.  She resents today because she has had pain and tenderness to the right great toe joint which has progressively gotten worse over several years despite shoe gear modifications.  Patient states that she is unable to wear many shoes at all because of the daily pain that she experiences to the great toe joint.  It is affecting her daily activities and she would like to have surgery to the right great toe joint.  She presents for further treatment and evaluation  Past Medical History:  Diagnosis Date   Atrophic vaginitis    Cancer ([place]) 1996   Breast Cancer-radiation-lumpectomy   Diverticulitis    DUB (dysfunctional uterine bleeding)    Endometrial polyp    Hypertension    IC (interstitial cystitis)    Migraines    Osteopenia    Thyroid disease    Goiter    Past Surgical History:  Procedure Laterality Date   BREAST SURGERY     Lumpectomy-reconstruction   CESAREAN SECTION     DILATION AND CURETTAGE OF UTERUS  2000   TUBAL LIGATION     VAGINAL HYSTERECTOMY  2003    No Known Allergies   Physical Exam: General: The patient is alert and oriented x3 in no acute distress.  Dermatology: Skin is warm, dry and supple bilateral lower extremities. Negative for open lesions or macerations.  Vascular: Palpable pedal pulses bilaterally. Capillary refill within normal limits.  Negative for any significant edema or erythema  Neurological: Light  touch and protective threshold grossly intact  Musculoskeletal Exam: No pedal deformities noted.  No range of motion noted of the first MTP left.  There is limited range of motion of the first MTP with pain on palpation and range of motion and crepitus noted.  Radiographic Exam:  Normal osseous mineralization.  Joint spaces preserved with exception of the first MTP right foot with degenerative changes and periarticular spurring consistent with hallux limitus.  The left foot demonstrates solid fusion of the first MTP with 2 crossing orthopedic screws which appeared to demonstrate good healing and fusion across the arthrodesis site  Assessment: 1.  Hallux limitus right 2. PSxHx first MTP arthrodesis left   Plan of Care:  1. Patient evaluated. X-Rays reviewed of both the left and the right foot.  2.  The patient does not like the arthrodesis procedure to the left foot because it limits her range of motion and ability to perform daily activities such as walking up and down steps without compensation.  Today we discussed the differences between arthrodesis versus arthroplasty with implant to the first MTP joint.  Benefits and disadvantages discussed with each.  I personally recommended first MTP arthroplasty with implant to the patient and she agrees and would like to proceed.  The surgery as well as postoperative recovery and course were explained in detail.  All patient questions answered.  No guarantees were expressed or  implied. 3.  Authorization for surgery was initiated today.  Surgery will consist of first MTP arthroplasty with implant right 4.  Return to clinic 1 week postop  *Semiretired interior design for commercial properties      Edrick Kins, DPM [facility]  Dr. Edrick Kins, DPM    [address], [place] [address]                Office [phone]  Fax [phone]

## 2021-10-08 ENCOUNTER — Telehealth: Payer: Self-pay | Admitting: Urology

## 2021-10-08 NOTE — Telephone Encounter (Signed)
DOS - 10/30/21  KELLER BUNION IMPLANT RIGHT --- [id number]  [employer] EFFECTIVE DATE - 03/09/21  PLAN DEDUCTIBLE - $0.00 OUT OF POCKET - $3,950.00 W/ $3,895.00 REMAINING COINSURANCE - $3,950.00 W/ $3,895.00 REMAINING COPAY - $275.00   NO PRIOR AUTH IS REQUIRED

## 2021-10-30 ENCOUNTER — Other Ambulatory Visit: Payer: Self-pay | Admitting: Podiatry

## 2021-10-30 DIAGNOSIS — M205X1 Other deformities of toe(s) (acquired), right foot: Secondary | ICD-10-CM | POA: Diagnosis not present

## 2021-10-30 MED ORDER — MELOXICAM 15 MG PO TABS: 15.0000 mg | ORAL_TABLET | Freq: Every day | ORAL | 1 refills | Status: AC

## 2021-10-30 MED ORDER — OXYCODONE-ACETAMINOPHEN 5-325 MG PO TABS: 1.0000 | ORAL_TABLET | ORAL | 0 refills | Status: DC | PRN

## 2021-10-30 NOTE — Progress Notes (Signed)
PRN postop 

## 2021-11-05 ENCOUNTER — Ambulatory Visit (INDEPENDENT_AMBULATORY_CARE_PROVIDER_SITE_OTHER): Payer: Medicare Other | Admitting: Podiatry

## 2021-11-05 ENCOUNTER — Ambulatory Visit (INDEPENDENT_AMBULATORY_CARE_PROVIDER_SITE_OTHER): Payer: Medicare Other

## 2021-11-05 DIAGNOSIS — M79671 Pain in right foot: Secondary | ICD-10-CM

## 2021-11-05 DIAGNOSIS — Z9889 Other specified postprocedural states: Secondary | ICD-10-CM

## 2021-11-05 NOTE — Progress Notes (Signed)
   Chief Complaint  Patient presents with   Post-op Follow-up    POV #1 DOS 10/30/2021 RT GREAT TOE ARTHROPLASTY W/IMPLANT    Subjective:  Patient presents today status post right great toe arthroplasty with implant. DOS: 10/30/2021.  Patient states that she is doing well.  Minimal to no pain.  She has been weightbearing in the cam boot as instructed.  No new complaints at this time  Past Medical History:  Diagnosis Date   Atrophic vaginitis    Cancer ([place]) 1996   Breast Cancer-radiation-lumpectomy   Diverticulitis    DUB (dysfunctional uterine bleeding)    Endometrial polyp    Hypertension    IC (interstitial cystitis)    Migraines    Osteopenia    Thyroid disease    Goiter    Past Surgical History:  Procedure Laterality Date   BREAST SURGERY     Lumpectomy-reconstruction   CESAREAN SECTION     DILATION AND CURETTAGE OF UTERUS  2000   TUBAL LIGATION     VAGINAL HYSTERECTOMY  2003    No Known Allergies  Objective/Physical Exam Neurovascular status intact.  Skin incisions appear to be well coapted with sutures intact. No sign of infectious process noted. No dehiscence. No active bleeding noted.  Negative for any significant edema or ecchymosis noted to the surgical extremity.  Radiographic Exam RT foot 11/05/2021:  Implant is intact.  Osteotomies of the metatarsal and base of the proximal phalanx are stable and clean.  Well-healing surgical foot.  Good alignment of the first ray  Assessment: 1. s/p right great toe arthroplasty with implant. DOS: 10/30/2021   Plan of Care:  1. Patient was evaluated. X-rays reviewed 2.  Patient may begin washing and showering and getting the foot wet 3.  Recommend triple antibiotic ointment and a large Band-Aid over the small incision site 4.  Continue weightbearing in the postsurgical shoe 5.  Return to clinic in 1 week for possible suture removal   Edrick Kins, DPM [facility]  Dr. Edrick Kins, DPM    [address], [place] [address]                Office [phone]  Fax [phone]

## 2021-11-12 ENCOUNTER — Ambulatory Visit (INDEPENDENT_AMBULATORY_CARE_PROVIDER_SITE_OTHER): Payer: Medicare Other

## 2021-11-12 ENCOUNTER — Ambulatory Visit (INDEPENDENT_AMBULATORY_CARE_PROVIDER_SITE_OTHER): Payer: Medicare Other | Admitting: Podiatry

## 2021-11-12 DIAGNOSIS — Z9889 Other specified postprocedural states: Secondary | ICD-10-CM

## 2021-11-12 NOTE — Progress Notes (Signed)
   Chief Complaint  Patient presents with   Routine Post Op    Right foot post op DOS : 10/30/2021 NO N/V/F/C/SOB     Subjective:  Patient presents today status post right great toe arthroplasty with implant. DOS: 10/30/2021.  Patient continues to do well.  She is weightbearing as tolerated in the cam boot with minimal pain.  No new complaints at this time  Past Medical History:  Diagnosis Date   Atrophic vaginitis    Cancer ([place]) 1996   Breast Cancer-radiation-lumpectomy   Diverticulitis    DUB (dysfunctional uterine bleeding)    Endometrial polyp    Hypertension    IC (interstitial cystitis)    Migraines    Osteopenia    Thyroid disease    Goiter    Past Surgical History:  Procedure Laterality Date   BREAST SURGERY     Lumpectomy-reconstruction   CESAREAN SECTION     DILATION AND CURETTAGE OF UTERUS  2000   TUBAL LIGATION     VAGINAL HYSTERECTOMY  2003    No Known Allergies  Objective/Physical Exam Neurovascular status intact.  Skin incisions appear to be well coapted with sutures intact. No sign of infectious process noted. No dehiscence. No active bleeding noted.  Negative for any significant edema or ecchymosis noted to the surgical extremity.  Radiographic Exam RT foot 11/05/2021:  Implant is intact.  Osteotomies of the metatarsal and base of the proximal phalanx are stable and clean.  Well-healing surgical foot.  Good alignment of the first ray  Assessment: 1. s/p right great toe arthroplasty with implant. DOS: 10/30/2021   Plan of Care:  1. Patient was evaluated.  2.  Sutures removed 3.  Postsurgical shoe dispensed.  Patient may discontinue the cam boot 4.  Compression ankle sleeve dispensed.  Wear daily  5.  Return to clinic in 2 weeks for follow-up x-ray and to transition the patient out of the surgical shoe into good supportive sneakers and begin range of motion exercises   Edrick Kins, DPM [facility]  Dr. Edrick Kins, DPM     [address], [place] [address]                Office [phone]  Fax [phone]

## 2021-11-26 ENCOUNTER — Ambulatory Visit (INDEPENDENT_AMBULATORY_CARE_PROVIDER_SITE_OTHER): Payer: Medicare Other | Admitting: Podiatry

## 2021-11-26 ENCOUNTER — Ambulatory Visit (INDEPENDENT_AMBULATORY_CARE_PROVIDER_SITE_OTHER): Payer: Medicare Other

## 2021-11-26 DIAGNOSIS — Z9889 Other specified postprocedural states: Secondary | ICD-10-CM

## 2021-11-26 NOTE — Progress Notes (Signed)
   Chief Complaint  Patient presents with   Post-op Follow-up    POV #3 DOS 10/30/2021 RT GREAT TOE ARTHROPLASTY W/IMPLANT    Subjective:  Patient presents today status post right great toe arthroplasty with implant. DOS: 10/30/2021.  Patient continues to do very well.  She has been wearing the surgical shoe with no complaints.  Past Medical History:  Diagnosis Date   Atrophic vaginitis    Cancer ([place]) 1996   Breast Cancer-radiation-lumpectomy   Diverticulitis    DUB (dysfunctional uterine bleeding)    Endometrial polyp    Hypertension    IC (interstitial cystitis)    Migraines    Osteopenia    Thyroid disease    Goiter    Past Surgical History:  Procedure Laterality Date   BREAST SURGERY     Lumpectomy-reconstruction   CESAREAN SECTION     DILATION AND CURETTAGE OF UTERUS  2000   TUBAL LIGATION     VAGINAL HYSTERECTOMY  2003    No Known Allergies  Objective/Physical Exam Neurovascular status intact.  Skin incision healed.  Good range of motion to the first MTP joint.  No pain on palpation or range of motion.  Minimal edema noted localized around the first MTP surgical site  Radiographic Exam RT foot 11/26/2021:  No significant change since prior visit.  Implant is intact.  Osteotomies of the metatarsal and base of the proximal phalanx are stable and clean.  Well-healing surgical foot.  Good alignment of the first ray  Assessment: 1. s/p right great toe arthroplasty with implant. DOS: 10/30/2021   Plan of Care:  1. Patient was evaluated.  X-rays reviewed 2.  Patient may discontinue the postsurgical shoe and begin to transition into good supportive shoes and sneakers 3.  Slowly increase activity over the next 4 weeks 4.  After 4 weeks the patient may resume full activity no restrictions 5.  Return to clinic as needed   Edrick Kins, DPM [facility]  Dr. Edrick Kins, DPM    [address], [place]  [address]                Office [phone]  Fax [phone]

## 2022-11-08 ENCOUNTER — Emergency Department (HOSPITAL_BASED_OUTPATIENT_CLINIC_OR_DEPARTMENT_OTHER)
Admission: EM | Admit: 2022-11-08 | Discharge: 2022-11-08 | Disposition: A | Discharge status: Home / Self Care | Payer: Medicare Other | Attending: Emergency Medicine | Admitting: Emergency Medicine

## 2022-11-08 ENCOUNTER — Encounter (HOSPITAL_BASED_OUTPATIENT_CLINIC_OR_DEPARTMENT_OTHER): Payer: Self-pay

## 2022-11-08 ENCOUNTER — Other Ambulatory Visit: Payer: Self-pay

## 2022-11-08 DIAGNOSIS — D72829 Elevated white blood cell count, unspecified: Secondary | ICD-10-CM | POA: Insufficient documentation

## 2022-11-08 DIAGNOSIS — N390 Urinary tract infection, site not specified: Secondary | ICD-10-CM | POA: Insufficient documentation

## 2022-11-08 DIAGNOSIS — Z87891 Personal history of nicotine dependence: Secondary | ICD-10-CM | POA: Diagnosis not present

## 2022-11-08 DIAGNOSIS — Z853 Personal history of malignant neoplasm of breast: Secondary | ICD-10-CM | POA: Diagnosis not present

## 2022-11-08 DIAGNOSIS — I10 Essential (primary) hypertension: Secondary | ICD-10-CM | POA: Diagnosis not present

## 2022-11-08 DIAGNOSIS — Z79899 Other long term (current) drug therapy: Secondary | ICD-10-CM | POA: Insufficient documentation

## 2022-11-08 DIAGNOSIS — R339 Retention of urine, unspecified: Secondary | ICD-10-CM | POA: Diagnosis present

## 2022-11-08 LAB — URINALYSIS, ROUTINE W REFLEX MICROSCOPIC
Glucose, UA: 100 mg/dL — AB
Ketones, ur: NEGATIVE mg/dL
Nitrite: POSITIVE — AB
Protein, ur: 100 mg/dL — AB
Specific Gravity, Urine: 1.025 (ref 1.005–1.030)
pH: 5.5 (ref 5.0–8.0)

## 2022-11-08 LAB — CBC WITH DIFFERENTIAL/PLATELET
Abs Immature Granulocytes: 0.03 10*3/uL (ref 0.00–0.07)
Basophils Absolute: 0.1 10*3/uL (ref 0.0–0.1)
Basophils Relative: 1 %
Eosinophils Absolute: 0.2 10*3/uL (ref 0.0–0.5)
Eosinophils Relative: 2 %
HCT: 45.4 % (ref 36.0–46.0)
Hemoglobin: 14.6 g/dL (ref 12.0–15.0)
Immature Granulocytes: 0 %
Lymphocytes Relative: 19 %
Lymphs Abs: 2 10*3/uL (ref 0.7–4.0)
MCH: 29.7 pg (ref 26.0–34.0)
MCHC: 32.2 g/dL (ref 30.0–36.0)
MCV: 92.3 fL (ref 80.0–100.0)
Monocytes Absolute: 0.8 10*3/uL (ref 0.1–1.0)
Monocytes Relative: 8 %
Neutro Abs: 7.5 10*3/uL (ref 1.7–7.7)
Neutrophils Relative %: 70 %
Platelets: 232 10*3/uL (ref 150–400)
RBC: 4.92 MIL/uL (ref 3.87–5.11)
RDW: 12.7 % (ref 11.5–15.5)
WBC: 10.6 10*3/uL — ABNORMAL HIGH (ref 4.0–10.5)
nRBC: 0 % (ref 0.0–0.2)

## 2022-11-08 LAB — BASIC METABOLIC PANEL
Anion gap: 9 (ref 5–15)
BUN: 18 mg/dL (ref 8–23)
CO2: 29 mmol/L (ref 22–32)
Calcium: 9 mg/dL (ref 8.9–10.3)
Chloride: 101 mmol/L (ref 98–111)
Creatinine, Ser: 0.77 mg/dL (ref 0.44–1.00)
GFR, Estimated: >60 mL/min (ref >60)
Glucose, Bld: 106 mg/dL — ABNORMAL HIGH (ref 70–99)
Potassium: 3.6 mmol/L (ref 3.5–5.1)
Sodium: 139 mmol/L (ref 135–145)

## 2022-11-08 LAB — URINALYSIS, MICROSCOPIC (REFLEX): WBC, UA: >50 WBC/hpf (ref 0–5)

## 2022-11-08 MED ORDER — CEFDINIR 300 MG PO CAPS: 300.0000 mg | ORAL_CAPSULE | Freq: Two times a day (BID) | ORAL | 0 refills | Status: AC

## 2022-11-08 MED ORDER — SODIUM CHLORIDE 0.9 % IV SOLN: INTRAVENOUS | Status: DC | PRN

## 2022-11-08 MED ORDER — SODIUM CHLORIDE 0.9 % IV SOLN
1.0000 g | Freq: Once | INTRAVENOUS | Status: AC
  Administered 2022-11-08: 1 g via INTRAVENOUS
  Filled 2022-11-08: qty 10

## 2022-11-08 MED ORDER — OXYCODONE HCL 5 MG PO TABS
5.0000 mg | ORAL_TABLET | Freq: Four times a day (QID) | ORAL | 0 refills | Status: AC | PRN
Start: 2022-11-08 — End: ?

## 2022-11-08 MED ORDER — OXYCODONE-ACETAMINOPHEN 5-325 MG PO TABS
1.0000 | ORAL_TABLET | Freq: Once | ORAL | Status: AC
  Administered 2022-11-08: 1 via ORAL
  Filled 2022-11-08: qty 1

## 2022-11-08 NOTE — ED Notes (Signed)
ED Provider at bedside. 

## 2022-11-08 NOTE — ED Provider Notes (Signed)
[place] [facility] Provider Note  CSN: [id number] Arrival date & time: 11/08/22 0940  Chief Complaint(s) Urinary Retention and Abdominal Pain  HPI Paige Spencer is a 72 y.o. female history of interstitial cystitis presenting with dysuria.  Reports dysuria for around 2 weeks, which has been worsening.  Also reports sensation of incomplete voiding some lower abdominal pain.  Occasionally has some flank pain.  No fevers or chills.  Feels similar to prior interstitial cystitis episodes but worse.  No nausea or vomiting.  Did not initially seek care because he felt like similar episodes of interstitial cystitis but now worsening.  Past Medical History Past Medical History:  Diagnosis Date   Atrophic vaginitis    Cancer (HCC) 1996   Breast Cancer-radiation-lumpectomy   Diverticulitis    DUB (dysfunctional uterine bleeding)    Endometrial polyp    Hypertension    IC (interstitial cystitis)    Migraines    Osteopenia    Thyroid disease    Goiter   Patient Active Problem List   Diagnosis Date Noted   Nevus, non-neoplastic 07/01/2011   Breast cancer in situ 11/12/2010   Endometrial polyp    Atrophic vaginitis    Osteopenia    DUB (dysfunctional uterine bleeding)    IC (interstitial cystitis)    Thyroid disease    Migraines    Hypertension    Home Medication(s) Prior to Admission medications   Medication Sig Start Date End Date Taking? Authorizing Provider  cefdinir (OMNICEF) 300 MG capsule Take 1 capsule (300 mg total) by mouth 2 (two) times daily for 7 days. 11/08/22 11/15/22 Yes Lonell Grandchild, MD  oxyCODONE (ROXICODONE) 5 MG immediate release tablet Take 1 tablet (5 mg total) by mouth every 6 (six) hours as needed for severe pain. 11/08/22  Yes Lonell Grandchild, MD  amLODipine (NORVASC) 5 MG tablet Take 5 mg by mouth daily.      [provider]  dicyclomine (BENTYL) 20 MG tablet Take 1 tablet (20 mg total) by mouth 2 (two)  times daily. 05/11/20   Palumbo, April, MD  DULoxetine (CYMBALTA) 60 MG capsule Take 60 mg by mouth daily.      [provider]  meloxicam (MOBIC) 15 MG tablet Take 1 tablet (15 mg total) by mouth daily. 10/30/21   Felecia Shelling, DPM  ondansetron (ZOFRAN) 4 MG tablet Take 1 tablet (4 mg total) by mouth every 8 (eight) hours as needed for nausea or vomiting. 05/11/20   Palumbo, April, MD  topiramate (TOPAMAX) 100 MG tablet Take 100 mg by mouth daily.      [provider]  UNABLE TO FIND Hyalo vag. gel    [provider]                                                                                                                                    Past  Surgical History Past Surgical History:  Procedure Laterality Date   BREAST SURGERY     Lumpectomy-reconstruction   CESAREAN SECTION     DILATION AND CURETTAGE OF UTERUS  2000   TUBAL LIGATION     VAGINAL HYSTERECTOMY  2003   Family History Family History  Problem Relation Age of Onset   Diabetes Mother    Hypertension Mother    Breast cancer Mother        Age 69   Heart disease Father    Breast cancer Sister        Age 59   Diabetes Brother    Hypertension Brother    Heart disease Brother     Social History Social History   Tobacco Use   Smoking status: Former   Smokeless tobacco: Never   Tobacco comments:    social smoker  Substance Use Topics   Alcohol use: Yes    Alcohol/week: 4.0 standard drinks of alcohol    Types: 4 Standard drinks or equivalent per week    Comment: wine   Drug use: No   Allergies Patient has no known allergies.  Review of Systems Review of Systems  All other systems reviewed and are negative.   Physical Exam Vital Signs  I have reviewed the triage vital signs BP (!) 140/91   Pulse 74   Temp 98 F (36.7 C) (Oral)   Resp 18   Ht 5\' 5"  (1.651 m)   Wt 71 kg   LMP 03/09/2001   SpO2 93%   BMI 26.05 kg/m  Physical Exam Vitals and nursing note reviewed.   Constitutional:      General: She is not in acute distress.    Appearance: She is well-developed.  HENT:     Head: Normocephalic and atraumatic.     Mouth/Throat:     Mouth: Mucous membranes are moist.  Eyes:     Pupils: Pupils are equal, round, and reactive to light.  Cardiovascular:     Rate and Rhythm: Normal rate and regular rhythm.     Heart sounds: No murmur heard. Pulmonary:     Effort: Pulmonary effort is normal. No respiratory distress.     Breath sounds: Normal breath sounds.  Abdominal:     General: Abdomen is flat.     Palpations: Abdomen is soft.     Tenderness: There is no abdominal tenderness. There is no right CVA tenderness or left CVA tenderness.  Musculoskeletal:        General: No tenderness.     Right lower leg: No edema.     Left lower leg: No edema.  Skin:    General: Skin is warm and dry.  Neurological:     General: No focal deficit present.     Mental Status: She is alert. Mental status is at baseline.  Psychiatric:        Mood and Affect: Mood normal.        Behavior: Behavior normal.     ED Results and Treatments Labs (all labs ordered are listed, but only abnormal results are displayed) Labs Reviewed  URINALYSIS, ROUTINE W REFLEX MICROSCOPIC - Abnormal; Notable for the following components:      Result Value   Color, Urine ORANGE (*)    APPearance CLOUDY (*)    Glucose, UA 100 (*)    Hgb urine dipstick MODERATE (*)    Bilirubin Urine SMALL (*)    Protein, ur 100 (*)    Nitrite POSITIVE (*)  Leukocytes,Ua LARGE (*)    All other components within normal limits  URINALYSIS, MICROSCOPIC (REFLEX) - Abnormal; Notable for the following components:   Bacteria, UA MANY (*)    All other components within normal limits  BASIC METABOLIC PANEL - Abnormal; Notable for the following components:   Glucose, Bld 106 (*)    All other components within normal limits  CBC WITH DIFFERENTIAL/PLATELET - Abnormal; Notable for the following components:    WBC 10.6 (*)    All other components within normal limits  URINE CULTURE                                                                                                                          Radiology No results found.  Pertinent labs & imaging results that were available during my care of the patient were reviewed by me and considered in my medical decision making (see MDM for details).  Medications Ordered in ED Medications  0.9 %  sodium chloride infusion ( Intravenous New Bag/Given 11/08/22 1126)  oxyCODONE-acetaminophen (PERCOCET/ROXICET) 5-325 MG per tablet 1 tablet (1 tablet Oral Given 11/08/22 1116)  cefTRIAXone (ROCEPHIN) 1 g in sodium chloride 0.9 % 100 mL IVPB (1 g Intravenous New Bag/Given 11/08/22 1128)                                                                                                                                     Procedures Procedures  (including critical care time)  Medical Decision Making / ED Course   MDM:  72 year old female presenting to the emergency department with urinary symptoms.  Urinalysis is consistent with urinary infection.  Will send urine culture.  Patient was having sensation of incomplete bladder emptying but postvoid residual is only 47 mL.  Given age will check basic labs.  Will give dose of ceftriaxone.  Lower concern for pyelonephritis with no systemic symptoms or CVA tenderness.  Patient has no significant pain to suggest underlying kidney stone.  Will reassess.  Clinical Course as of 11/08/22 1217  Wynelle Link Nov 08, 2022  1216 Labs reassuring.  Patient feels better after pain control and antibiotics.  Will discharge with cefdinir.  Patient request pain control for home, have been taking Tylenol without relief.  Will give a few oxycodone.  Checked PDMP. Will discharge patient to home. All questions answered. Patient comfortable with plan of discharge. Return precautions discussed with patient  and specified on the after visit summary.   [WS]    Clinical Course User Index [WS] Lonell Grandchild, MD     Additional history obtained: -Additional history obtained from spouse   Lab Tests: -I ordered, reviewed, and interpreted labs.   The pertinent results include:   Labs Reviewed  URINALYSIS, ROUTINE W REFLEX MICROSCOPIC - Abnormal; Notable for the following components:      Result Value   Color, Urine ORANGE (*)    APPearance CLOUDY (*)    Glucose, UA 100 (*)    Hgb urine dipstick MODERATE (*)    Bilirubin Urine SMALL (*)    Protein, ur 100 (*)    Nitrite POSITIVE (*)    Leukocytes,Ua LARGE (*)    All other components within normal limits  URINALYSIS, MICROSCOPIC (REFLEX) - Abnormal; Notable for the following components:   Bacteria, UA MANY (*)    All other components within normal limits  BASIC METABOLIC PANEL - Abnormal; Notable for the following components:   Glucose, Bld 106 (*)    All other components within normal limits  CBC WITH DIFFERENTIAL/PLATELET - Abnormal; Notable for the following components:   WBC 10.6 (*)    All other components within normal limits  URINE CULTURE    Notable for mild leukocytosis, signs of UTI  Medicines ordered and prescription drug management: Meds ordered this encounter  Medications   oxyCODONE-acetaminophen (PERCOCET/ROXICET) 5-325 MG per tablet 1 tablet   cefTRIAXone (ROCEPHIN) 1 g in sodium chloride 0.9 % 100 mL IVPB    Order Specific Question:   Antibiotic Indication:    Answer:   UTI   0.9 %  sodium chloride infusion    Carrier Fluid Protocol   cefdinir (OMNICEF) 300 MG capsule    Sig: Take 1 capsule (300 mg total) by mouth 2 (two) times daily for 7 days.    Dispense:  14 capsule    Refill:  0   oxyCODONE (ROXICODONE) 5 MG immediate release tablet    Sig: Take 1 tablet (5 mg total) by mouth every 6 (six) hours as needed for severe pain.    Dispense:  3 tablet    Refill:  0    -I have reviewed the patients home medicines and have made adjustments as  needed   Cardiac Monitoring: The patient was maintained on a cardiac monitor.  I personally viewed and interpreted the cardiac monitored which showed an underlying rhythm of: NSR   Reevaluation: After the interventions noted above, I reevaluated the patient and found that their symptoms have improved  Co morbidities that complicate the patient evaluation  Past Medical History:  Diagnosis Date   Atrophic vaginitis    Cancer (HCC) 1996   Breast Cancer-radiation-lumpectomy   Diverticulitis    DUB (dysfunctional uterine bleeding)    Endometrial polyp    Hypertension    IC (interstitial cystitis)    Migraines    Osteopenia    Thyroid disease    Goiter      Dispostion: Disposition decision including need for hospitalization was considered, and patient discharged from emergency department.    Final Clinical Impression(s) / ED Diagnoses Final diagnoses:  Acute lower UTI     This chart was dictated using voice recognition software.  Despite best efforts to proofread,  errors can occur which can change the documentation meaning.    Lonell Grandchild, MD 11/08/22 [phone]

## 2022-11-08 NOTE — ED Triage Notes (Signed)
The patient stated she has been having lower abd pain and stated she is unable to urinate like normally its a small stream.

## 2022-11-08 NOTE — Discharge Instructions (Addendum)
We evaluated you for your urinary symptoms.  Your urinalysis shows that you have a urine infection.  Your blood tests were reassuring and our ultrasound shows that you are emptying your bladder.  We have prescribed you 7 days of antibiotics.  Please start taking these tomorrow.  We already given you a dose of antibiotics in the emergency department which will last until tomorrow.  I have prescribed you a few oxycodone for breakthrough pain.  Please try 1000 milligrams of Tylenol every 6 hours first for pain.  If your pain is not improved with this you can take an oxycodone.  Do not drink alcohol or drive while taking oxycodone.  If you have any new or worsening symptoms such as fevers or chills, vomiting, lightheadedness or dizziness, severe pain, please return to the emergency department.

## 2022-11-10 LAB — URINE CULTURE: Culture: >=100000 — AB

## 2022-11-11 ENCOUNTER — Telehealth (HOSPITAL_BASED_OUTPATIENT_CLINIC_OR_DEPARTMENT_OTHER): Payer: Self-pay | Admitting: *Deleted

## 2022-11-11 NOTE — Telephone Encounter (Signed)
Post ED Visit - Positive Culture Follow-up  Culture report reviewed by antimicrobial stewardship pharmacist: [facility] []  Enzo Bi, Pharm.D. []  Celedonio Miyamoto, Pharm.D., BCPS AQ-ID []  Garvin Fila, Pharm.D., BCPS []  Georgina Pillion, Pharm.D., BCPS []  [place], [place].D., BCPS, AAHIVP []  Estella Husk, Pharm.D., BCPS, AAHIVP []  Lysle Pearl, PharmD, BCPS []  Phillips Climes, PharmD, BCPS []  Agapito Games, PharmD, BCPS []  Verlan Friends, PharmD []  Mervyn Gay, PharmD, BCPS []  Vinnie Level, PharmD  [facility] []  Len Childs, PharmD []  Greer Pickerel, PharmD []  Adalberto Cole, PharmD []  Perlie Gold, Rph []  Lonell Face) Jean Rosenthal, PharmD []  Earl Many, PharmD []  Junita Push, PharmD []  Dorna Leitz, PharmD []  Terrilee Files, PharmD []  Lynann Beaver, PharmD []  Keturah Barre, PharmD []  Loralee Pacas, PharmD []  Bernadene Person, PharmD   Positive urine culture Treated with Cefdinir, organism sensitive to the same and no further patient follow-up is required at this time.  Virl Axe [facility] 11/11/2022, 9:15 AM
